# Patient Record
Sex: Male | Born: 1960 | Race: White | Hispanic: No | Marital: Married | Smoking: Never smoker
Health system: Southern US, Community
[De-identification: ages and names within clinical notes are randomized; demographics above are authoritative.]

## PROBLEM LIST (undated history)

## (undated) DIAGNOSIS — J189 Pneumonia, unspecified organism: Secondary | ICD-10-CM

## (undated) DIAGNOSIS — G43909 Migraine, unspecified, not intractable, without status migrainosus: Secondary | ICD-10-CM

## (undated) DIAGNOSIS — F32A Depression, unspecified: Secondary | ICD-10-CM

## (undated) DIAGNOSIS — K5732 Diverticulitis of large intestine without perforation or abscess without bleeding: Secondary | ICD-10-CM

## (undated) DIAGNOSIS — K640 First degree hemorrhoids: Secondary | ICD-10-CM

## (undated) DIAGNOSIS — T7840XA Allergy, unspecified, initial encounter: Secondary | ICD-10-CM

## (undated) DIAGNOSIS — K635 Polyp of colon: Secondary | ICD-10-CM

## (undated) DIAGNOSIS — G4700 Insomnia, unspecified: Secondary | ICD-10-CM

## (undated) DIAGNOSIS — F329 Major depressive disorder, single episode, unspecified: Secondary | ICD-10-CM

## (undated) DIAGNOSIS — F419 Anxiety disorder, unspecified: Secondary | ICD-10-CM

## (undated) DIAGNOSIS — M810 Age-related osteoporosis without current pathological fracture: Secondary | ICD-10-CM

## (undated) DIAGNOSIS — R37 Sexual dysfunction, unspecified: Secondary | ICD-10-CM

## (undated) DIAGNOSIS — K219 Gastro-esophageal reflux disease without esophagitis: Secondary | ICD-10-CM

## (undated) DIAGNOSIS — F319 Bipolar disorder, unspecified: Secondary | ICD-10-CM

## (undated) DIAGNOSIS — E785 Hyperlipidemia, unspecified: Secondary | ICD-10-CM

## (undated) HISTORY — DX: Bipolar disorder, unspecified: F31.9

## (undated) HISTORY — PX: SHOULDER ARTHROSCOPY: SHX128

## (undated) HISTORY — DX: Hyperlipidemia, unspecified: E78.5

## (undated) HISTORY — PX: TONSILLECTOMY: SUR1361

## (undated) HISTORY — PX: ROTATOR CUFF REPAIR: SHX139

## (undated) HISTORY — DX: First degree hemorrhoids: K64.0

## (undated) HISTORY — PX: UPPER GASTROINTESTINAL ENDOSCOPY: SHX188

## (undated) HISTORY — DX: Depression, unspecified: F32.A

## (undated) HISTORY — DX: Anxiety disorder, unspecified: F41.9

## (undated) HISTORY — DX: Gastro-esophageal reflux disease without esophagitis: K21.9

## (undated) HISTORY — DX: Major depressive disorder, single episode, unspecified: F32.9

## (undated) HISTORY — DX: Polyp of colon: K63.5

## (undated) HISTORY — DX: Allergy, unspecified, initial encounter: T78.40XA

## (undated) HISTORY — DX: Diverticulitis of large intestine without perforation or abscess without bleeding: K57.32

## (undated) HISTORY — DX: Sexual dysfunction, unspecified: R37

## (undated) HISTORY — DX: Migraine, unspecified, not intractable, without status migrainosus: G43.909

## (undated) HISTORY — PX: CARPAL TUNNEL RELEASE: SHX101

## (undated) HISTORY — DX: Pneumonia, unspecified organism: J18.9

## (undated) HISTORY — PX: COLONOSCOPY: SHX174

## (undated) HISTORY — DX: Insomnia, unspecified: G47.00

## (undated) HISTORY — PX: APPENDECTOMY: SHX54

## (undated) HISTORY — PX: SHOULDER SURGERY: SHX246

## (undated) HISTORY — DX: Age-related osteoporosis without current pathological fracture: M81.0

---

## 2009-04-20 LAB — HM DEXA SCAN

## 2011-06-26 DIAGNOSIS — K635 Polyp of colon: Secondary | ICD-10-CM

## 2011-06-26 DIAGNOSIS — K5732 Diverticulitis of large intestine without perforation or abscess without bleeding: Secondary | ICD-10-CM

## 2011-06-26 DIAGNOSIS — K640 First degree hemorrhoids: Secondary | ICD-10-CM

## 2011-06-26 HISTORY — DX: Polyp of colon: K63.5

## 2011-06-26 HISTORY — DX: Diverticulitis of large intestine without perforation or abscess without bleeding: K57.32

## 2011-06-26 HISTORY — DX: First degree hemorrhoids: K64.0

## 2011-06-26 LAB — HM COLONOSCOPY

## 2015-08-11 ENCOUNTER — Ambulatory Visit (INDEPENDENT_AMBULATORY_CARE_PROVIDER_SITE_OTHER): Payer: BLUE CROSS/BLUE SHIELD | Admitting: Medical

## 2015-08-11 ENCOUNTER — Encounter: Payer: Self-pay | Admitting: Medical

## 2015-08-11 VITALS — BP 102/70 | HR 68 | Ht 67.25 in | Wt 185.0 lb

## 2015-08-11 DIAGNOSIS — M25511 Pain in right shoulder: Secondary | ICD-10-CM | POA: Diagnosis not present
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
## 2015-08-16 ENCOUNTER — Telehealth: Payer: Self-pay | Admitting: Internal Medicine

## 2015-08-16 NOTE — Telephone Encounter (Signed)
Rcvd Medical Records from [facility] on pt.

## 2015-08-17 ENCOUNTER — Encounter: Payer: Self-pay | Admitting: Physical Therapy

## 2015-08-17 ENCOUNTER — Ambulatory Visit: Payer: BLUE CROSS/BLUE SHIELD | Attending: Medical | Admitting: Physical Therapy

## 2015-08-17 DIAGNOSIS — M6281 Muscle weakness (generalized): Secondary | ICD-10-CM | POA: Diagnosis not present

## 2015-08-17 DIAGNOSIS — M25511 Pain in right shoulder: Secondary | ICD-10-CM | POA: Diagnosis not present
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
## 2015-08-17 NOTE — Patient Instructions (Signed)
External Rotator Cuff Stretch, Supine    Lie supine, fingers clasped behind head, elbows close together. Pull elbows backward while pinching shoulder blades. Hold _10__ seconds. Repeat _5__ times per session. Do _2__ sessions per day.  Copyright  VHI. All rights reserved.  Cane Exercise: Flexion    Lie on back, holding cane above chest. Keeping arms as straight as possible, lower cane toward floor beyond head. Hold _5__ seconds. Repeat ___10_ times. Do ___2_ sessions per day.  http://gt2.exer.us/91   Copyright  VHI. All rights reserved.  ROM: Pendulum (Circular)    Let right arm move in circle clockwise, then counterclockwise, by rocking body weight in circular pattern. Circle __10__ times each direction per set. Do __2__ sets per session. Do _2___ sessions per day.  http://orth.exer.us/794   Copyright  VHI. All rights reserved.  [facility] [address] [place], [place] [address] Phone # [phone] Fax [phone]

## 2015-08-19 ENCOUNTER — Ambulatory Visit: Payer: BLUE CROSS/BLUE SHIELD | Admitting: Physical Therapy

## 2015-08-19 ENCOUNTER — Encounter: Payer: Self-pay | Admitting: Physical Therapy

## 2015-08-19 DIAGNOSIS — M25511 Pain in right shoulder: Secondary | ICD-10-CM | POA: Diagnosis not present

## 2015-08-19 DIAGNOSIS — M6281 Muscle weakness (generalized): Secondary | ICD-10-CM | POA: Diagnosis not present

## 2015-08-19 NOTE — Patient Instructions (Signed)
Over Head Pull: Wide Grip    On back, knees bent, feet flat, band across thighs, elbows straight but relaxed. Pull hands apart (start). Keeping elbows straight, bring arms up and over head, hands toward floor. Keep steady pull on band. Hold momentarily. Return slowly, keeping pull steady, back to start. Repeat __10_ times. Band color _yellow_____    Copyright  VHI. All rights reserved.  Side Pull: Double Arm    On back, knees bent, feet flat. Arms perpendicular to body, shoulder level, elbows straight but relaxed. Pull arms out to sides, elbows straight. Resistance band comes across collarbones, hands toward floor. Hold momentarily. Slowly return to starting position. Repeat _10__ times. Band color _yellow____    Copyright  VHI. All rights reserved.  Strengthening: Isometric External Rotation    Using wall to provide resistance, and keeping right arm at side, press back of hand into ball using light pressure where there is no pain. Hold __5__ seconds. Repeat __5__ times per set. Do ___1_ sets per session. Do __1__ sessions per day.  http://orth.exer.us/814   Copyright  VHI. All rights reserved.   IONTOPHORESIS PATIENT PRECAUTIONS & CONTRAINDICATIONS:  . Redness under one or both electrodes can occur.  This characterized by a uniform redness that usually disappears within 12 hours of treatment. . Small pinhead size blisters may result in response to the drug.  Contact your physician if the problem persists more than 24 hours. . On rare occasions, iontophoresis therapy can result in temporary skin reactions such as rash, inflammation, irritation or burns.  The skin reactions may be the result of individual sensitivity to the ionic solution used, the condition of the skin at the start of treatment, reaction to the materials in the electrodes, allergies or sensitivity to dexamethasone, or a poor connection between the patch and your skin.  Discontinue using iontophoresis if you have any  of these reactions and report to your therapist. . Remove the Patch or electrodes if you have any undue sensation of pain or burning during the treatment and report discomfort to your therapist. . Tell your Therapist if you have had known adverse reactions to the application of electrical current. . If using the Patch, the LED light will turn off when treatment is complete and the patch can be removed.  Approximate treatment time is 1-3 hours.  Remove the patch when light goes off or after 6 hours. . The Patch can be worn during normal activity, however excessive motion where the electrodes have been placed can cause poor contact between the skin and the electrode or uneven electrical current resulting in greater risk of skin irritation. Marland Kitchen Keep out of the reach of children.  DO NOT use if you have a cardiac pacemaker or any other electrically sensitive implanted device. . DO NOT use if you have a known sensitivity to dexamethasone. . DO NOT use during Magnetic Resonance Imaging (MRI). . DO NOT use over broken or compromised skin (e.g. sunburn, cuts, or acne) due to the increased risk of skin reaction. . DO NOT SHAVE over the area to be treated:  To establish good contact between the Patch and the skin, excessive hair may be clipped. . DO NOT place the Patch or electrodes on or over your eyes, directly over your heart, or brain. . DO NOT reuse the Patch or electrodes as this may cause burns to occur. [place] [address] [place], [place] [address] Phone # [phone] Fax [phone]
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
## 2015-08-22 ENCOUNTER — Encounter (HOSPITAL_COMMUNITY): Payer: Self-pay

## 2015-08-22 ENCOUNTER — Emergency Department (HOSPITAL_COMMUNITY)
Admission: EM | Admit: 2015-08-22 | Discharge: 2015-08-22 | Disposition: A | Discharge status: Home / Self Care | Payer: BLUE CROSS/BLUE SHIELD | Attending: Emergency Medicine | Admitting: Emergency Medicine

## 2015-08-22 DIAGNOSIS — K0889 Other specified disorders of teeth and supporting structures: Secondary | ICD-10-CM | POA: Insufficient documentation

## 2015-08-22 DIAGNOSIS — F329 Major depressive disorder, single episode, unspecified: Secondary | ICD-10-CM | POA: Diagnosis not present

## 2015-08-22 DIAGNOSIS — Z791 Long term (current) use of non-steroidal anti-inflammatories (NSAID): Secondary | ICD-10-CM | POA: Diagnosis not present

## 2015-08-22 DIAGNOSIS — Z79899 Other long term (current) drug therapy: Secondary | ICD-10-CM | POA: Diagnosis not present

## 2015-08-22 MED ORDER — AMOXICILLIN-POT CLAVULANATE 875-125 MG PO TABS: 1.0000 | ORAL_TABLET | Freq: Two times a day (BID) | ORAL | Status: DC

## 2015-08-22 MED ORDER — HYDROCODONE-ACETAMINOPHEN 5-325 MG PO TABS
1.0000 | ORAL_TABLET | Freq: Once | ORAL | Status: AC
  Administered 2015-08-22: 1 via ORAL
  Filled 2015-08-22: qty 1

## 2015-08-22 MED ORDER — BUPIVACAINE-EPINEPHRINE (PF) 0.5% -1:200000 IJ SOLN
1.8000 mL | Freq: Once | INTRAMUSCULAR | Status: AC
  Administered 2015-08-22: 1.8 mL
  Filled 2015-08-22: qty 1.8

## 2015-08-22 MED ORDER — NAPROXEN 500 MG PO TABS: 500.0000 mg | ORAL_TABLET | Freq: Two times a day (BID) | ORAL | Status: DC

## 2015-08-22 MED ORDER — AMOXICILLIN-POT CLAVULANATE 875-125 MG PO TABS
1.0000 | ORAL_TABLET | Freq: Once | ORAL | Status: AC
  Administered 2015-08-22: 1 via ORAL
  Filled 2015-08-22: qty 1

## 2015-08-22 NOTE — ED Notes (Signed)
Dental pain since Friday and getting worse.  Lump under jaw.

## 2015-08-22 NOTE — Discharge Instructions (Signed)
[facility] [employer] is a great source of information about community services available.  Access by dialing 2-1-1 from anywhere in [place], or by website -  CustodianSupply.fi.   Other Local Resources (Updated 04/2015)  Dental  Care   Services    Phone Number and Address  Cost  [facility] For children 62 - 54 years of age:   Cleaning  Tooth brushing/flossing instruction  Sealants, fillings, crowns  Extractions  Emergency treatment  [phone] [address], [place] [address] Charges based on family income.  Medicaid and some insurance plans accepted.     [facility] - [facility], fillings, crowns  Extractions  Emergency treatment [phone] [address] [place], [place]  Pregnant women 39 years of age or older with a Medicaid card  [facility] - [place]  Cleaning  Sealants, fillings, crowns  Extractions  Emergency treatment [phone] [address] [place], [place] Pregnant women 73 years of age or older with a Medicaid card  [facility] For children 43 - 33 years of age:   Cleaning  Tooth brushing/flossing instruction  Sealants, fillings, crowns  Extractions  Emergency treatment Limited orthodontic services for patients with Medicaid [phone], [place] [address] Medicaid and [facility] Health Choice cover for children up to age 71 and pregnant women.  Parents of children up to age 47 without Medicaid pay a reduced fee at time of service.  [place] [facility] For children 88 - 33 years of age:   Cleaning  Tooth brushing/flossing instruction  Sealants, fillings, crowns  Extractions  Emergency treatment Limited orthodontic services for patients with Medicaid  [phone] [place], [place].  Medicaid and [facility] [place] Health Choice cover for children up to age 26 and pregnant women.  Parents of children up to age 42 without Medicaid pay a reduced fee.  [facility]  Sealants, fillings, crowns  Extractions  Hours: Tuesdays and Thursdays, 4:15 - 8 pm [phone] [address] [place], [place] [address] Services free of charge to [facility] residents ages 18-64 who do not have health insurance, Medicare, [place], or [place] benefits and fall within federal poverty guidelines  [place] care in addition to primary medical care, nutritional counseling, and pharmacy:  Engineer, drilling, fillings, crowns  Extractions                  [phone] [facility] [place], [place], [place], [place], [place], [place] [facility], [place], [place] [facility] [place], [place], [place], most insurance.  Also provides services available to all with fees adjusted based on ability to pay.    [facility]  Cleaning  Tooth brushing/flossing instruction  Sealants, fillings, crowns  Extractions  Emergency treatment Hours: Tuesdays, Thursdays, and Fridays from 8 am to 5 pm by appointment only. [phone] [place], [place] [address] [facility] residents with Medicaid (depending on eligibility) and children with [facility] Health Choice - call for more information.  [facility] • Extractions only ° °Hours: 2nd and 4th Thursday of each month from 6:30 am - 9 am.   [phone] ext. [phone] °[place], [place] [address] Ages 18 and older only.  Patients are seen on a first come, first served basis.  °[facility] • Cleanings °• Fillings °• Extractions °• Orthodontics °• Endodontics °• Implants/Crowns/Bridges °• Complete and partial dentures [phone] °[place], [place] Patients must complete an application for services.  There is often a waiting list.   ° °
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
## 2015-08-24 ENCOUNTER — Encounter: Payer: BLUE CROSS/BLUE SHIELD | Admitting: Physical Therapy

## 2015-08-26 ENCOUNTER — Ambulatory Visit: Payer: BLUE CROSS/BLUE SHIELD | Admitting: Physical Therapy

## 2015-08-26 ENCOUNTER — Encounter: Payer: Self-pay | Admitting: Physical Therapy

## 2015-08-26 DIAGNOSIS — M25511 Pain in right shoulder: Secondary | ICD-10-CM

## 2015-08-26 DIAGNOSIS — M6281 Muscle weakness (generalized): Secondary | ICD-10-CM | POA: Diagnosis not present
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
## 2015-08-31 ENCOUNTER — Ambulatory Visit: Payer: BLUE CROSS/BLUE SHIELD | Admitting: Physical Therapy

## 2015-08-31 ENCOUNTER — Telehealth: Payer: Self-pay | Admitting: Medical

## 2015-08-31 ENCOUNTER — Encounter: Payer: Self-pay | Admitting: Physical Therapy

## 2015-08-31 DIAGNOSIS — M6281 Muscle weakness (generalized): Secondary | ICD-10-CM | POA: Diagnosis not present

## 2015-08-31 DIAGNOSIS — M25511 Pain in right shoulder: Secondary | ICD-10-CM | POA: Diagnosis not present

## 2015-08-31 NOTE — Patient Instructions (Signed)
ROM: Abduction (Standing)    Bring arms straight out from sides and raise as high as possible without pain. Face wall. Relax upper trap. No shoulders in ears.  Repeat __10__ times per set. Do _2___ sets per session. Do _1___ sessions per day.  http://orth.exer.us/910     Copyright  VHI. All rights reserved.  ROM: Flexion (Standing)    Bring arms straight out in front and raise as high as possible without pain. Keep palms facing up. Back against the wall. Tighten abdominals.  Repeat __10__ times per set. Do _2___ sets per session. Do __1__ sessions per day.  http://orth.exer.us/908   Copyright  VHI. All rights reserved.     Secure a band or belt high around a stable object. Place one hand into the loop and turn the hand so the palm is facing toward the ceiling. Create tension in the band. Fold forward by pushing the hips back until a stretch is felt in the shoulder and down through the side of the rib cage & back. Keep the abdominals activated and contract the glutes to ensure a neutral spine during the stretch.  Do on counter with both arms.  Hold for 30 seconds 2 times 1 time per day.    Scapular Retraction: Abduction / Extension (Prone)    Lie with arms out from sides 90. Pinch shoulder blades together and raise arms a few inches from floor. Repeat _10___ times per set. Do _2___ sets per session. Do __1__ sessions per day.  http://orth.exer.us/958   Copyright  VHI. All rights reserved.   Scapular Retraction (Prone)    Lie with arms at sides. Pinch shoulder blades together and raise arms a few inches from floor. Repeat _10___ times per set. Do __2__ sets per session. Do _1___ sessions per day.  http://orth.exer.us/954   Copyright  VHI. All rights reserved.   [place] [address] [place], [place] [address] Phone # [phone] Fax [phone]

## 2015-08-31 NOTE — Telephone Encounter (Signed)
error 
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
## 2015-09-02 ENCOUNTER — Ambulatory Visit: Payer: BLUE CROSS/BLUE SHIELD | Admitting: Physical Therapy

## 2015-09-07 ENCOUNTER — Encounter: Payer: BLUE CROSS/BLUE SHIELD | Admitting: Physical Therapy

## 2015-09-09 ENCOUNTER — Encounter: Payer: BLUE CROSS/BLUE SHIELD | Admitting: Physical Therapy

## 2015-11-01 ENCOUNTER — Encounter: Payer: Self-pay | Admitting: Medical

## 2015-11-01 ENCOUNTER — Ambulatory Visit (INDEPENDENT_AMBULATORY_CARE_PROVIDER_SITE_OTHER): Payer: BLUE CROSS/BLUE SHIELD | Admitting: Medical

## 2015-11-01 VITALS — BP 138/84 | HR 64 | Temp 97.8°F | Wt 193.6 lb

## 2015-11-01 DIAGNOSIS — M859 Disorder of bone density and structure, unspecified: Secondary | ICD-10-CM

## 2015-11-01 DIAGNOSIS — M8580 Other specified disorders of bone density and structure, unspecified site: Secondary | ICD-10-CM

## 2015-11-01 DIAGNOSIS — F319 Bipolar disorder, unspecified: Secondary | ICD-10-CM

## 2015-11-01 DIAGNOSIS — M25511 Pain in right shoulder: Secondary | ICD-10-CM | POA: Insufficient documentation

## 2015-11-01 DIAGNOSIS — F3130 Bipolar disorder, current episode depressed, mild or moderate severity, unspecified: Secondary | ICD-10-CM | POA: Diagnosis not present

## 2015-11-01 DIAGNOSIS — R7989 Other specified abnormal findings of blood chemistry: Secondary | ICD-10-CM

## 2015-11-01 DIAGNOSIS — E291 Testicular hypofunction: Secondary | ICD-10-CM | POA: Insufficient documentation

## 2015-11-01 DIAGNOSIS — Z79899 Other long term (current) drug therapy: Secondary | ICD-10-CM

## 2015-11-01 DIAGNOSIS — G4700 Insomnia, unspecified: Secondary | ICD-10-CM | POA: Diagnosis not present

## 2015-11-01 LAB — CBC
HEMATOCRIT: 43.5 % (ref 38.5–50.0)
Hemoglobin: 14.6 g/dL (ref 13.2–17.1)
MCH: 29.6 pg (ref 27.0–33.0)
MCHC: 33.6 g/dL (ref 32.0–36.0)
MCV: 88.2 fL (ref 80.0–100.0)
MPV: 8.8 fL (ref 7.5–12.5)
Platelets: 281 10*3/uL (ref 140–400)
RBC: 4.93 MIL/uL (ref 4.20–5.80)
RDW: 14.5 % (ref 11.0–15.0)
WBC: 6.7 10*3/uL (ref 4.0–10.5)

## 2015-11-01 LAB — TSH: TSH: 1.41 mIU/L (ref 0.40–4.50)

## 2015-11-01 MED ORDER — ESCITALOPRAM OXALATE 10 MG PO TABS: 10.0000 mg | ORAL_TABLET | Freq: Every day | ORAL | 2 refills | Status: DC

## 2015-11-01 MED ORDER — TRAZODONE HCL 50 MG PO TABS: 50.0000 mg | ORAL_TABLET | Freq: Every day | ORAL | 2 refills | Status: DC

## 2015-11-01 MED ORDER — RISPERIDONE 1 MG PO TABS
ORAL_TABLET | ORAL | 1 refills | Status: DC
Start: 2015-11-01 — End: 2015-12-26
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
## 2015-11-02 ENCOUNTER — Other Ambulatory Visit: Payer: Self-pay | Admitting: Medical

## 2015-11-02 DIAGNOSIS — M25511 Pain in right shoulder: Secondary | ICD-10-CM | POA: Diagnosis not present

## 2015-11-02 LAB — COMPREHENSIVE METABOLIC PANEL
ALT: 36 U/L (ref 9–46)
AST: 26 U/L (ref 10–35)
Albumin: 4.2 g/dL (ref 3.6–5.1)
Alkaline Phosphatase: 64 U/L (ref 40–115)
BUN: 16 mg/dL (ref 7–25)
CHLORIDE: 105 mmol/L (ref 98–110)
CO2: 26 mmol/L (ref 20–31)
CREATININE: 1.09 mg/dL (ref 0.70–1.33)
Calcium: 9.8 mg/dL (ref 8.6–10.3)
GLUCOSE: 114 mg/dL — AB (ref 65–99)
Potassium: 4.9 mmol/L (ref 3.5–5.3)
SODIUM: 142 mmol/L (ref 135–146)
TOTAL PROTEIN: 6.6 g/dL (ref 6.1–8.1)
Total Bilirubin: 0.5 mg/dL (ref 0.2–1.2)

## 2015-11-02 LAB — VITAMIN D 25 HYDROXY (VIT D DEFICIENCY, FRACTURES): Vit D, 25-Hydroxy: 28 ng/mL — ABNORMAL LOW (ref 30–100)

## 2015-11-02 LAB — TESTOSTERONE: TESTOSTERONE: 163 ng/dL — AB (ref 250–827)

## 2015-11-02 LAB — PSA: PSA: 0.53 ng/mL (ref <=4.00)

## 2015-11-02 MED ORDER — VITAMIN D (ERGOCALCIFEROL) 1.25 MG (50000 UNIT) PO CAPS: 50000.0000 [IU] | ORAL_CAPSULE | ORAL | 1 refills | Status: DC

## 2015-11-16 ENCOUNTER — Ambulatory Visit (INDEPENDENT_AMBULATORY_CARE_PROVIDER_SITE_OTHER): Payer: BLUE CROSS/BLUE SHIELD | Admitting: Medical

## 2015-11-16 ENCOUNTER — Encounter: Payer: Self-pay | Admitting: Medical

## 2015-11-16 VITALS — BP 114/78 | HR 73 | Wt 193.0 lb

## 2015-11-16 DIAGNOSIS — R7989 Other specified abnormal findings of blood chemistry: Secondary | ICD-10-CM

## 2015-11-16 DIAGNOSIS — E559 Vitamin D deficiency, unspecified: Secondary | ICD-10-CM | POA: Diagnosis not present

## 2015-11-16 DIAGNOSIS — G4700 Insomnia, unspecified: Secondary | ICD-10-CM

## 2015-11-16 DIAGNOSIS — E291 Testicular hypofunction: Secondary | ICD-10-CM

## 2015-11-16 DIAGNOSIS — F3130 Bipolar disorder, current episode depressed, mild or moderate severity, unspecified: Secondary | ICD-10-CM | POA: Diagnosis not present

## 2015-11-16 DIAGNOSIS — R7301 Impaired fasting glucose: Secondary | ICD-10-CM

## 2015-11-16 DIAGNOSIS — F319 Bipolar disorder, unspecified: Secondary | ICD-10-CM

## 2015-11-16 DIAGNOSIS — M8580 Other specified disorders of bone density and structure, unspecified site: Secondary | ICD-10-CM

## 2015-11-16 DIAGNOSIS — M859 Disorder of bone density and structure, unspecified: Secondary | ICD-10-CM

## 2015-11-16 MED ORDER — TESTOSTERONE 10 MG/ACT (2%) TD GEL: 2.0000 | Freq: Every day | TRANSDERMAL | 2 refills | Status: DC
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
## 2015-11-17 ENCOUNTER — Other Ambulatory Visit: Payer: Self-pay | Admitting: Medical

## 2015-11-17 DIAGNOSIS — R7989 Other specified abnormal findings of blood chemistry: Secondary | ICD-10-CM

## 2015-11-17 DIAGNOSIS — E229 Hyperfunction of pituitary gland, unspecified: Principal | ICD-10-CM

## 2015-11-17 DIAGNOSIS — E291 Testicular hypofunction: Secondary | ICD-10-CM

## 2015-11-17 LAB — FSH/LH
FSH: 1.9 m[IU]/mL (ref 1.6–8.0)
LH: 1.5 m[IU]/mL (ref 1.5–9.3)

## 2015-11-17 LAB — TESTOSTERONE: Testosterone: 167 ng/dL — ABNORMAL LOW (ref 250–827)

## 2015-11-17 LAB — PROLACTIN: PROLACTIN: 23.1 ng/mL — AB (ref 2.0–18.0)

## 2015-11-18 ENCOUNTER — Other Ambulatory Visit: Payer: Self-pay | Admitting: Medical

## 2015-11-18 DIAGNOSIS — R7989 Other specified abnormal findings of blood chemistry: Secondary | ICD-10-CM

## 2015-11-18 DIAGNOSIS — E291 Testicular hypofunction: Secondary | ICD-10-CM

## 2015-11-18 DIAGNOSIS — E229 Hyperfunction of pituitary gland, unspecified: Principal | ICD-10-CM

## 2015-11-26 ENCOUNTER — Ambulatory Visit
Admission: RE | Admit: 2015-11-26 | Discharge: 2015-11-26 | Disposition: A | Discharge status: Home / Self Care | Payer: BLUE CROSS/BLUE SHIELD | Source: Ambulatory Visit | Attending: Medical | Admitting: Medical

## 2015-11-26 DIAGNOSIS — E291 Testicular hypofunction: Secondary | ICD-10-CM

## 2015-11-26 DIAGNOSIS — R7989 Other specified abnormal findings of blood chemistry: Secondary | ICD-10-CM

## 2015-11-26 DIAGNOSIS — E229 Hyperfunction of pituitary gland, unspecified: Principal | ICD-10-CM

## 2015-11-26 DIAGNOSIS — R930 Abnormal findings on diagnostic imaging of skull and head, not elsewhere classified: Secondary | ICD-10-CM | POA: Diagnosis not present

## 2015-11-26 MED ORDER — GADOBENATE DIMEGLUMINE 529 MG/ML IV SOLN
10.0000 mL | Freq: Once | INTRAVENOUS | Status: AC | PRN
  Administered 2015-11-26: 10 mL via INTRAVENOUS

## 2015-12-26 ENCOUNTER — Other Ambulatory Visit: Payer: Self-pay | Admitting: Medical

## 2015-12-26 DIAGNOSIS — F319 Bipolar disorder, unspecified: Secondary | ICD-10-CM

## 2015-12-27 NOTE — Telephone Encounter (Signed)
Is this okay to refill? 

## 2016-01-03 ENCOUNTER — Ambulatory Visit (INDEPENDENT_AMBULATORY_CARE_PROVIDER_SITE_OTHER): Payer: BLUE CROSS/BLUE SHIELD | Admitting: Medical

## 2016-01-03 ENCOUNTER — Ambulatory Visit: Payer: BLUE CROSS/BLUE SHIELD | Admitting: Family Medicine

## 2016-01-03 ENCOUNTER — Encounter: Payer: Self-pay | Admitting: Medical

## 2016-01-03 VITALS — BP 106/84 | HR 88 | Ht 67.25 in | Wt 192.0 lb

## 2016-01-03 DIAGNOSIS — F3130 Bipolar disorder, current episode depressed, mild or moderate severity, unspecified: Secondary | ICD-10-CM

## 2016-01-03 DIAGNOSIS — F411 Generalized anxiety disorder: Secondary | ICD-10-CM | POA: Diagnosis not present

## 2016-01-03 DIAGNOSIS — F319 Bipolar disorder, unspecified: Secondary | ICD-10-CM

## 2016-01-03 DIAGNOSIS — E291 Testicular hypofunction: Secondary | ICD-10-CM | POA: Diagnosis not present

## 2016-01-03 DIAGNOSIS — F410 Panic disorder [episodic paroxysmal anxiety] without agoraphobia: Secondary | ICD-10-CM | POA: Insufficient documentation

## 2016-01-03 DIAGNOSIS — E349 Endocrine disorder, unspecified: Secondary | ICD-10-CM | POA: Diagnosis not present

## 2016-01-03 DIAGNOSIS — R7989 Other specified abnormal findings of blood chemistry: Secondary | ICD-10-CM

## 2016-01-03 MED ORDER — CLONAZEPAM 0.5 MG PO TABS: 0.5000 mg | ORAL_TABLET | Freq: Two times a day (BID) | ORAL | 0 refills | Status: DC | PRN
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
## 2016-01-13 ENCOUNTER — Encounter: Payer: Self-pay | Admitting: Medical

## 2016-01-17 ENCOUNTER — Ambulatory Visit (HOSPITAL_COMMUNITY)
Admission: RE | Admit: 2016-01-17 | Discharge: 2016-01-17 | Disposition: A | Discharge status: Home / Self Care | Payer: Self-pay | Attending: Psychiatry | Admitting: Psychiatry

## 2016-01-17 DIAGNOSIS — F329 Major depressive disorder, single episode, unspecified: Secondary | ICD-10-CM | POA: Insufficient documentation

## 2016-01-17 DIAGNOSIS — F411 Generalized anxiety disorder: Secondary | ICD-10-CM | POA: Insufficient documentation
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
## 2016-01-17 NOTE — H&P (Signed)
Behavioral Health Medical Screening Exam  Jesse Hardin is an 55 y.o. male who presented as a walk in with his mother in law for reports of worsening depression. He reports a similar episode "eleven years ago", which responded to an out of state IOP program. At this time the patient does not meet criteria for inpatient admission but is appropriate for referral to IOP program. Denies current suicidal ideation. The patient denies any significant medical history during assessment.   Total Time spent with patient: 20 minutes  Psychiatric Specialty Exam: Physical Exam  Constitutional: He is oriented to person, place, and time. He appears well-developed and well-nourished.  HENT:  Head: Normocephalic and atraumatic.  Right Ear: External ear normal.  Left Ear: External ear normal.  Mouth/Throat: Oropharynx is clear and moist.  Eyes: Conjunctivae are normal. Pupils are equal, round, and reactive to light.  Neck: Normal range of motion. Neck supple.  Cardiovascular: Normal rate, regular rhythm, normal heart sounds and intact distal pulses.   Respiratory: Effort normal and breath sounds normal.  GI: Soft. Bowel sounds are normal.  Musculoskeletal: Normal range of motion.  Neurological: He is alert and oriented to person, place, and time.  Skin: Skin is warm and dry.    Review of Systems  Constitutional: Negative for fever and weight loss.  HENT: Negative for ear pain, hearing loss and tinnitus.   Eyes: Negative for blurred vision, double vision and photophobia.  Respiratory: Negative for cough, hemoptysis and sputum production.   Cardiovascular: Negative for chest pain, palpitations and orthopnea.  Gastrointestinal: Negative for heartburn, nausea and vomiting.  Genitourinary: Negative for dysuria, frequency and urgency.  Musculoskeletal: Negative for myalgias and neck pain.  Skin: Negative for itching and rash.  Neurological: Negative for dizziness, tingling, tremors, sensory change and  headaches.  Psychiatric/Behavioral: Positive for depression. Negative for hallucinations, memory loss, substance abuse and suicidal ideas. The patient is nervous/anxious and has insomnia.     Blood pressure 124/77, pulse 72, temperature 98.6 F (37 C), resp. rate 18.There is no height or weight on file to calculate BMI.  General Appearance: Casual  Eye Contact:  Good  Speech:  Clear and Coherent  Volume:  Normal  Mood:  Depressed  Affect:  Congruent  Thought Process:  Coherent  Orientation:  Full (Time, Place, and Person)  Thought Content:  Symptoms of depression  Suicidal Thoughts:  No  Homicidal Thoughts:  No  Memory:  Immediate;   Good Recent;   Good Remote;   Good  Judgement:  Intact  Insight:  Present  Psychomotor Activity:  Normal  Concentration: Concentration: Fair and Attention Span: Good  Recall:  Good  Fund of Knowledge:Good  Language: Good  Akathisia:  No  Handed:  Right  AIMS (if indicated):     Assets:  Communication Skills Desire for Improvement Financial Resources/Insurance Housing Intimacy Leisure Time Physical Health Resilience  Sleep:       Musculoskeletal: Strength & Muscle Tone: within normal limits Gait & Station: normal Patient leans: N/A  Blood pressure 124/77, pulse 72, temperature 98.6 F (37 C), resp. rate 18.  Recommendations:  Based on my evaluation the patient does not appear to have an emergency medical condition.  Elmarie Shiley, NP 01/17/2016, 7:26 PM

## 2016-01-20 ENCOUNTER — Other Ambulatory Visit (HOSPITAL_COMMUNITY): Payer: BLUE CROSS/BLUE SHIELD | Attending: Psychiatry | Admitting: Psychiatry

## 2016-01-20 ENCOUNTER — Encounter (HOSPITAL_COMMUNITY): Payer: Self-pay | Admitting: Psychiatry

## 2016-01-20 DIAGNOSIS — F332 Major depressive disorder, recurrent severe without psychotic features: Secondary | ICD-10-CM | POA: Insufficient documentation

## 2016-01-20 DIAGNOSIS — F419 Anxiety disorder, unspecified: Secondary | ICD-10-CM | POA: Insufficient documentation

## 2016-01-20 DIAGNOSIS — G4700 Insomnia, unspecified: Secondary | ICD-10-CM | POA: Diagnosis not present

## 2016-01-20 DIAGNOSIS — G43909 Migraine, unspecified, not intractable, without status migrainosus: Secondary | ICD-10-CM | POA: Insufficient documentation

## 2016-01-20 DIAGNOSIS — F319 Bipolar disorder, unspecified: Secondary | ICD-10-CM | POA: Insufficient documentation

## 2016-01-20 DIAGNOSIS — F900 Attention-deficit hyperactivity disorder, predominantly inattentive type: Secondary | ICD-10-CM | POA: Diagnosis not present

## 2016-01-20 DIAGNOSIS — F3130 Bipolar disorder, current episode depressed, mild or moderate severity, unspecified: Secondary | ICD-10-CM | POA: Diagnosis not present

## 2016-01-20 MED ORDER — BUPROPION HCL ER (SR) 200 MG PO TB12: 200.0000 mg | ORAL_TABLET | Freq: Two times a day (BID) | ORAL | 0 refills | Status: DC

## 2016-01-20 NOTE — Progress Notes (Signed)
Comprehensive Clinical Assessment (CCA) Note  01/20/2016 Jesse Hardin [id number]  Visit Diagnosis:      ICD-9-CM ICD-10-CM   1. Severe recurrent major depression without psychotic features ([place]) 296.33 F33.2       CCA Part One  Part One has been completed on paper by the patient.  (See scanned document in Chart Review)  CCA Part Two A  Intake/Chief Complaint:  CCA Intake With Chief Complaint CCA Part Two Date: 01/20/16 CCA Part Two Time: [id number] Chief Complaint/Presenting Problem: This is a 55 yr old, married, unemployed male; who was referred per TTS; treatment for worsening depressive and anxiety symptoms.  Admits to passive SI; denies a plan or intent.  Discussed safety options at length with pt.  Pt is able to contract for safety.  Denies any prior suicide attempts or gestures.  Reports prior MH-IOP treatment in [place] after the divorce from wife in 1996.  Pt has been seeing therapist Deneise Lever Hadgkiss, LCSW) for ~ three visits.  Pt reports he has been feeling increasingly depressed over the last several weeks due to losing a job opportunity. Pt reports he was "really excited and happy" about the job and when he learned he did not get it, he became depressed. Pt reports he has been waking up feeling "cloudy" and does not desire to get out of bed. Pt reports his husband was out of town which was another stressor for him; but husband cut business trip short to come be home with pt.   Pt reports he has been feeling sad for no reason and he cannot explain or understand why.   Pt did voice having anxiety about his finances.  Before moving to [place] from [place], he cashed out his retirement fund in Jan. 2017.  The couple moved to [place] due to husband's job.  Pt reports he had a recent change in his medications (PCP increased the Lexapro two weeks ago to 20 mg; and started him on Risperdal )and he feels this may be a contributing factor to his increasing depression. Pt is requesting to start back on  Wellbutrin; which helped him in the past.  Family HX:  Mother and Sister (Depression). Patients Currently Reported Symptoms/Problems: Sadness, tearfulness, anhedonia, lack of motivation, no energy, poor sleep (difficulty getting sleep), poor appetite (lost 10 lbs within two weeks), isolative, poor concentration, ruminating thoughts, feelings of hopelessness, helplessness, poor self-esteem.                            Collateral Involvement: Husband is very supportive. Individual's Strengths: Pt is very insightful. Type of Services Patient Feels Are Needed: MH-IOP  Mental Health Symptoms Depression:  Depression: Change in energy/activity, Difficulty Concentrating, Hopelessness, Increase/decrease in appetite, Irritability, Tearfulness, Sleep (too much or little)  Mania:  Mania: N/A  Anxiety:   Anxiety: Worrying  Psychosis:  Psychosis: N/A  Trauma:  Trauma: N/A  Obsessions:  Obsessions: N/A  Compulsions:     Inattention:     Hyperactivity/Impulsivity:     Oppositional/Defiant Behaviors:     Borderline Personality:     Other Mood/Personality Symptoms:      Mental Status Exam Appearance and self-care  Stature:  Stature: Average  Weight:  Weight: Average weight  Clothing:  Clothing: Casual  Grooming:  Grooming: Normal  Cosmetic use:  Cosmetic Use: None  Posture/gait:  Posture/Gait: Normal  Motor activity:  Motor Activity: Not Remarkable  Sensorium  Attention:  Attention: Normal  Concentration:  Concentration: Normal  Orientation:  Orientation: X5  Recall/memory:  Recall/Memory: Normal  Affect and Mood  Affect:  Affect: Depressed  Mood:  Mood: Anxious  Relating  Eye contact:  Eye Contact: Normal  Facial expression:  Facial Expression: Sad  Attitude toward examiner:  Attitude Toward Examiner: Cooperative  Thought and Language  Speech flow: Speech Flow: Normal  Thought content:  Thought Content: Appropriate to mood and circumstances  Preoccupation:     Hallucinations:      Organization:     Transport planner of Knowledge:  Fund of Knowledge: Average  Intelligence:  Intelligence: Average  Abstraction:  Abstraction: Normal  Judgement:  Judgement: Normal  Reality Testing:  Reality Testing: Adequate  Insight:  Insight: Good  Decision Making:  Decision Making: Normal  Social Functioning  Social Maturity:  Social Maturity: Isolates  Social Judgement:  Social Judgement: Normal  Stress  Stressors:  Stressors: Brewing technologist, Work, Psychologist, forensic Ability:  Coping Ability: English as a second language teacher Deficits:     Supports:      Family and Psychosocial History: Family history Marital status: Married Number of Years Married: 3 What types of issues is patient dealing with in the relationship?: n/a Additional relationship information: Was married to a male; but she left the marriage on April Fool's Day in 1996.  "She stated she didn't want to be married to me any longer." What is your sexual orientation?: homosexual Does patient have children?: No (hx of fostering kids)  Childhood History:  Childhood History By whom was/is the patient raised?: Both parents Additional childhood history information: Born in [place], [place].  "Good childhood."  Reports father being his best friend.  "I have codenpendency issues since childhood.  It's not that I didn't want to play with the other kids, but we just didn't have anything in common."  Pt describes himself as being a "loner" in school.  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  Denies any trauma or abuse. Description of patient's relationship with caregiver when they were a child: Very close to father Patient's description of current relationship with people who raised him/her: close to parents Does patient have siblings?: Yes Number of Siblings: 2 Description of patient's current relationship with siblings: Both sisters resides near [place], [place].  According to pt, he's really not close to either one.  one is very conservative and the  other is racist. Did patient suffer any verbal/emotional/physical/sexual abuse as a child?: No Did patient suffer from severe childhood neglect?: No Has patient ever been sexually abused/assaulted/raped as an adolescent or adult?: No Was the patient ever a victim of a crime or a disaster?: No Witnessed domestic violence?: No Has patient been effected by domestic violence as an adult?: No  CCA Part Two B  Employment/Work Situation: Employment / Work Copywriter, advertising Employment situation: Unemployed What is the longest time patient has a held a job?: 32 yrs Where was the patient employed at that time?: [employer] in [place] Has patient ever been in the [employer]?: No Has patient ever served in combat?: No Did You Receive Any Psychiatric Treatment/Services While in Passenger transport manager?: No Are There Guns or Other Weapons in [place]?: No Are These Psychologist, educational?:  (n/a)  Education: Education Did Teacher, adult education From [employer]?: Yes Did Physicist, medical?: Yes What Type of College Degree Do you Have?: BS Did [place]?: No 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 Did You Have An Individualized Education Program (IIEP): No Did You Have Any Difficulty At School?: No  Religion:  Religion/Spirituality Are You A Religious Person?: Yes What is Your Religious Affiliation?: Christian  Leisure/Recreation: Leisure / Recreation Leisure and Hobbies: Woodworking, model trains, photography  Exercise/Diet: Exercise/Diet Do You Exercise?: No Have You Gained or Lost A Significant Amount of Weight in the Past Six Months?: Yes-Lost Number of Pounds Lost?: 10 Do You Follow a Special Diet?: No Do You Have Any Trouble Sleeping?: Yes Explanation of Sleeping Difficulties: Getting to sleep  CCA Part Two C  Alcohol/Drug Use: Alcohol / Drug Use Pain Medications: Pt denies abuse Prescriptions: Pt denies abuse Over the Counter: Pt denies abuse History of alcohol / drug use?: No history of alcohol  / drug abuse                      CCA Part Three  ASAM's:  Six Dimensions of Multidimensional Assessment  Dimension 1:  Acute Intoxication and/or Withdrawal Potential:     Dimension 2:  Biomedical Conditions and Complications:     Dimension 3:  Emotional, Behavioral, or Cognitive Conditions and Complications:     Dimension 4:  Readiness to Change:     Dimension 5:  Relapse, Continued use, or Continued Problem Potential:     Dimension 6:  Recovery/Living Environment:      Substance use Disorder (SUD)    Social Function:  Social Functioning Social Maturity: Isolates Social Judgement: Normal  Stress:  Stress Stressors: Grief/losses, Work, Barista Ability: Overwhelmed Patient Takes Medications The Way The Doctor Instructed?: Yes Priority Risk: High Risk  Risk Assessment- Self-Harm Potential: Risk Assessment For Self-Harm Potential Thoughts of Self-Harm: Vague current thoughts (able to contract for safety) Method: No plan Availability of Means: No access/NA  Risk Assessment -Dangerous to Others Potential: Risk Assessment For Dangerous to Others Potential Method: No Plan Availability of Means: No access or NA Intent: Vague intent or NA Notification Required: No need or identified person  DSM5 Diagnoses: Patient Active Problem List   Diagnosis Date Noted  . Severe recurrent major depression without psychotic features ([place]) 01/20/2016    Class: Chronic  . Panic attack 01/03/2016  . Generalized anxiety disorder 01/03/2016  . Vitamin D deficiency 11/16/2015  . Bipolar disorder with depression ([place]) 11/01/2015  . Insomnia 11/01/2015  . Right shoulder pain 11/01/2015  . Low testosterone 11/01/2015  . Hypogonadism in male 11/01/2015  . Low bone density 11/01/2015    Patient Centered Plan: Patient is on the following Treatment Plan(s):  Depression  Recommendations for Services/Supports/Treatments: Recommendations for  Services/Supports/Treatments Recommendations For Services/Supports/Treatments: IOP (Intensive Outpatient Program)  Treatment Plan Summary:  Pt will participate in [place] on a daily basis; to learn effective coping skills.  F/U with Isabelle Course, [facility] and a psychiatrist.  Encouraged support groups.  Referral to Voc Rehab.  Referrals to Alternative Service(s): Referred to Alternative Service(s):   Place:   Date:   Time:    Referred to Alternative Service(s):   Place:   Date:   Time:    Referred to Alternative Service(s):   Place:   Date:   Time:    Referred to Alternative Service(s):   Place:   Date:   Time:     CLARK, RITA, M.Ed, CNA
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
## 2016-01-21 ENCOUNTER — Other Ambulatory Visit (HOSPITAL_COMMUNITY): Payer: BLUE CROSS/BLUE SHIELD | Admitting: Psychiatry

## 2016-01-21 DIAGNOSIS — F332 Major depressive disorder, recurrent severe without psychotic features: Secondary | ICD-10-CM

## 2016-01-21 DIAGNOSIS — F419 Anxiety disorder, unspecified: Secondary | ICD-10-CM | POA: Diagnosis not present

## 2016-01-21 DIAGNOSIS — G4700 Insomnia, unspecified: Secondary | ICD-10-CM | POA: Diagnosis not present

## 2016-01-21 DIAGNOSIS — F319 Bipolar disorder, unspecified: Secondary | ICD-10-CM | POA: Diagnosis not present

## 2016-01-21 DIAGNOSIS — G43909 Migraine, unspecified, not intractable, without status migrainosus: Secondary | ICD-10-CM | POA: Diagnosis not present

## 2016-01-21 NOTE — Progress Notes (Signed)
    Daily Group Progress Note  Program: IOP  Group Time: 9:00-12:00  Participation Level: Active  Behavioral Response: Appropriate  Type of Therapy:  Group Therapy  Summary of Progress: Pt.'s first day in group, was mostly quiet and observant of group process. Pt. Met with case manager and psychiatrist. Pt. Participated in session facilitated by the mental health association and became appropriately tearful when discussing his need for social support.      Nancie Neas, LPC

## 2016-01-24 ENCOUNTER — Other Ambulatory Visit (HOSPITAL_COMMUNITY): Payer: Self-pay

## 2016-01-24 ENCOUNTER — Other Ambulatory Visit (HOSPITAL_COMMUNITY): Payer: BLUE CROSS/BLUE SHIELD | Admitting: Psychiatry

## 2016-01-24 DIAGNOSIS — F5101 Primary insomnia: Secondary | ICD-10-CM

## 2016-01-24 DIAGNOSIS — F332 Major depressive disorder, recurrent severe without psychotic features: Secondary | ICD-10-CM | POA: Diagnosis not present

## 2016-01-24 DIAGNOSIS — G4700 Insomnia, unspecified: Secondary | ICD-10-CM | POA: Diagnosis not present

## 2016-01-24 DIAGNOSIS — F419 Anxiety disorder, unspecified: Secondary | ICD-10-CM | POA: Diagnosis not present

## 2016-01-24 DIAGNOSIS — F319 Bipolar disorder, unspecified: Secondary | ICD-10-CM | POA: Diagnosis not present

## 2016-01-24 DIAGNOSIS — G43909 Migraine, unspecified, not intractable, without status migrainosus: Secondary | ICD-10-CM | POA: Diagnosis not present

## 2016-01-24 MED ORDER — TRAZODONE HCL 50 MG PO TABS: 50.0000 mg | ORAL_TABLET | Freq: Every day | ORAL | 0 refills | Status: DC

## 2016-01-24 MED ORDER — GABAPENTIN 300 MG PO CAPS: 300.0000 mg | ORAL_CAPSULE | Freq: Every day | ORAL | 0 refills | Status: DC

## 2016-01-24 NOTE — Progress Notes (Signed)
90 day supply sent to the pharmacy of patients Gabapentin and Trazodone - insurance will only cover 90 day. These were called in by Dr. Lovena Le last week

## 2016-01-25 ENCOUNTER — Other Ambulatory Visit (HOSPITAL_COMMUNITY): Payer: BLUE CROSS/BLUE SHIELD | Admitting: Psychiatry

## 2016-01-25 DIAGNOSIS — F332 Major depressive disorder, recurrent severe without psychotic features: Secondary | ICD-10-CM

## 2016-01-25 DIAGNOSIS — F319 Bipolar disorder, unspecified: Secondary | ICD-10-CM | POA: Diagnosis not present

## 2016-01-25 DIAGNOSIS — F419 Anxiety disorder, unspecified: Secondary | ICD-10-CM | POA: Diagnosis not present

## 2016-01-25 DIAGNOSIS — G4700 Insomnia, unspecified: Secondary | ICD-10-CM | POA: Diagnosis not present

## 2016-01-25 DIAGNOSIS — G43909 Migraine, unspecified, not intractable, without status migrainosus: Secondary | ICD-10-CM | POA: Diagnosis not present

## 2016-01-25 MED ORDER — CLONAZEPAM 0.5 MG PO TABS: 0.5000 mg | ORAL_TABLET | Freq: Two times a day (BID) | ORAL | 0 refills | Status: DC | PRN

## 2016-01-25 NOTE — Telephone Encounter (Signed)
Called in the clonazepam but did not record the note in th medications list so corrected it today

## 2016-01-25 NOTE — Progress Notes (Signed)
    Daily Group Progress Note  Program: IOP Group Time: 9:00-12:00   Participation Level: active   Behavioral Response: engaged/responsive   Type of Therapy:   group therapy   Summary of Progress:  Patient reported having a difficult morning, and that feelings of sadness were leading to excessive tearfulness. 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 Counselor validated his upset, but also offered a reframe: while he did not receive the job he has received clarity about what he wants his next career to be like. Counselor also suggested volunteering as a way to break the isolation he's experiencing during the job search. Patient seemed both skeptical and thoughtful around this idea.    Nancie Neas, LPC

## 2016-01-26 ENCOUNTER — Other Ambulatory Visit (HOSPITAL_COMMUNITY): Payer: BLUE CROSS/BLUE SHIELD | Admitting: Psychiatry

## 2016-01-26 ENCOUNTER — Encounter: Payer: Self-pay | Admitting: Medical

## 2016-01-26 DIAGNOSIS — G43909 Migraine, unspecified, not intractable, without status migrainosus: Secondary | ICD-10-CM | POA: Diagnosis not present

## 2016-01-26 DIAGNOSIS — F419 Anxiety disorder, unspecified: Secondary | ICD-10-CM | POA: Diagnosis not present

## 2016-01-26 DIAGNOSIS — G4700 Insomnia, unspecified: Secondary | ICD-10-CM | POA: Diagnosis not present

## 2016-01-26 DIAGNOSIS — F332 Major depressive disorder, recurrent severe without psychotic features: Secondary | ICD-10-CM

## 2016-01-26 DIAGNOSIS — F319 Bipolar disorder, unspecified: Secondary | ICD-10-CM | POA: Diagnosis not present

## 2016-01-26 NOTE — Progress Notes (Signed)
    Daily Group Progress Note  Program: IOP  Group Time: 9:00-12:00   Participation Level: active   Behavioral Response: engaged/responsive   Type of Therapy:   group therapy   Summary of Progress:  Patient reported crippling morning anxiety. Counselor encourage client to search for awareness of what precedes his anxiety. Patient fears not being able to find employment, and falling into tax debt. Patient made some connection to how his thoughts tend to spin before he becomes anxious, and challenged the reality of some of his cognitions with encouragement from the group. Pt. Participated in medication management group with the pharmacist.  Nancie Neas, [facility]

## 2016-01-27 ENCOUNTER — Other Ambulatory Visit (HOSPITAL_COMMUNITY): Payer: BLUE CROSS/BLUE SHIELD | Admitting: Psychiatry

## 2016-01-27 ENCOUNTER — Telehealth: Payer: Self-pay | Admitting: Medical

## 2016-01-27 ENCOUNTER — Other Ambulatory Visit: Payer: Self-pay | Admitting: Medical

## 2016-01-27 DIAGNOSIS — G4700 Insomnia, unspecified: Secondary | ICD-10-CM | POA: Diagnosis not present

## 2016-01-27 DIAGNOSIS — F419 Anxiety disorder, unspecified: Secondary | ICD-10-CM | POA: Diagnosis not present

## 2016-01-27 DIAGNOSIS — F332 Major depressive disorder, recurrent severe without psychotic features: Secondary | ICD-10-CM | POA: Diagnosis not present

## 2016-01-27 DIAGNOSIS — F411 Generalized anxiety disorder: Secondary | ICD-10-CM

## 2016-01-27 DIAGNOSIS — R002 Palpitations: Secondary | ICD-10-CM

## 2016-01-27 DIAGNOSIS — F319 Bipolar disorder, unspecified: Secondary | ICD-10-CM | POA: Diagnosis not present

## 2016-01-27 DIAGNOSIS — G43909 Migraine, unspecified, not intractable, without status migrainosus: Secondary | ICD-10-CM | POA: Diagnosis not present

## 2016-01-27 NOTE — Telephone Encounter (Signed)
This is an [place] - This patient will come in today or tomorrow for lab visit/nurse visit.

## 2016-01-27 NOTE — Progress Notes (Signed)
    Daily Group Progress Note  Program: IOP  Group Time: 9:00-12:00   Participation Level:  active  Behavioral Response:  responsive   Type of Therapy:   group therapy   Summary of Progress:  Patient was very upset about the intensity of his anxiety this morning. He was expressing fear that it would never cease. Counselor challenged the patient to sit with his anxiety, and to recall times when he did not feel so anxious. Patient expressed a lot of fears around never getting a job, and being left by his husband. However, he responded positively CBT exercise where we wrote and illustrated our negative thoughts then wrote and illustrated a challenge to that thought.  Nancie Neas, LPC

## 2016-01-28 ENCOUNTER — Other Ambulatory Visit: Payer: BLUE CROSS/BLUE SHIELD

## 2016-01-28 ENCOUNTER — Other Ambulatory Visit (HOSPITAL_COMMUNITY): Payer: BLUE CROSS/BLUE SHIELD | Admitting: Psychiatry

## 2016-01-28 DIAGNOSIS — F332 Major depressive disorder, recurrent severe without psychotic features: Secondary | ICD-10-CM

## 2016-01-28 DIAGNOSIS — F419 Anxiety disorder, unspecified: Secondary | ICD-10-CM | POA: Diagnosis not present

## 2016-01-28 DIAGNOSIS — F411 Generalized anxiety disorder: Secondary | ICD-10-CM

## 2016-01-28 DIAGNOSIS — F319 Bipolar disorder, unspecified: Secondary | ICD-10-CM | POA: Diagnosis not present

## 2016-01-28 DIAGNOSIS — R002 Palpitations: Secondary | ICD-10-CM

## 2016-01-28 DIAGNOSIS — G4700 Insomnia, unspecified: Secondary | ICD-10-CM | POA: Diagnosis not present

## 2016-01-28 DIAGNOSIS — G43909 Migraine, unspecified, not intractable, without status migrainosus: Secondary | ICD-10-CM | POA: Diagnosis not present

## 2016-01-28 LAB — T4, FREE: Free T4: 1.2 ng/dL (ref 0.8–1.8)

## 2016-01-28 LAB — TSH: TSH: 1.06 m[IU]/L (ref 0.40–4.50)

## 2016-01-31 ENCOUNTER — Other Ambulatory Visit (HOSPITAL_COMMUNITY): Payer: BLUE CROSS/BLUE SHIELD | Admitting: Psychiatry

## 2016-01-31 DIAGNOSIS — G43909 Migraine, unspecified, not intractable, without status migrainosus: Secondary | ICD-10-CM | POA: Diagnosis not present

## 2016-01-31 DIAGNOSIS — F332 Major depressive disorder, recurrent severe without psychotic features: Secondary | ICD-10-CM | POA: Diagnosis not present

## 2016-01-31 DIAGNOSIS — F319 Bipolar disorder, unspecified: Secondary | ICD-10-CM | POA: Diagnosis not present

## 2016-01-31 DIAGNOSIS — F419 Anxiety disorder, unspecified: Secondary | ICD-10-CM | POA: Diagnosis not present

## 2016-01-31 DIAGNOSIS — G4700 Insomnia, unspecified: Secondary | ICD-10-CM | POA: Diagnosis not present

## 2016-01-31 NOTE — Progress Notes (Signed)
    Daily Group Progress Note  Program: IOP  Group Time: 9:00-12:00   Participation Level: active  Behavioral Response:  responsive   Type of Therapy:   group therapy   Summary of Progress:  Patient was once again very upset about the intensity of his anxiety this morning. Patient expressed suicidal ideation, and the counselor unpacked this with him-and helped him to reframe his desire as one that is more about making the pain stop, rather than wanting to die.    Nancie Neas, LPC

## 2016-01-31 NOTE — Progress Notes (Signed)
    Daily Group Progress Note  Group Time: 9:00-12:00   Participation Level:  active   Behavioral Response:  responsive   Type of Therapy:   group therapy   Summary of Progress:  Patient continues to struggle with crippling morning anxiety. He expressed he wishes it would just go away, and feels confused by it's appearance in his life-since this is a new phenomena for him, which occurs seemingly at random. Counselor encouraged client to stop fighting the feeling-as this is likely creating more resistance to alleviating his anxiety. Patient felt it was hard to grasp this concept, so wrote it down. He's been out of work for the past year, after working for 32 years in a row. The counselor presented the idea that this change in routine is likely contributing to his current anxiety. Patient seemed to connect with this. Counselor encouraged the client to find a behavioral "anchor" in his day-something which can help him establish a new routine.   Nancie Neas, LPC

## 2016-02-01 ENCOUNTER — Other Ambulatory Visit (HOSPITAL_COMMUNITY): Payer: BLUE CROSS/BLUE SHIELD | Admitting: Psychiatry

## 2016-02-01 DIAGNOSIS — F419 Anxiety disorder, unspecified: Secondary | ICD-10-CM | POA: Diagnosis not present

## 2016-02-01 DIAGNOSIS — F332 Major depressive disorder, recurrent severe without psychotic features: Secondary | ICD-10-CM

## 2016-02-01 DIAGNOSIS — G4700 Insomnia, unspecified: Secondary | ICD-10-CM | POA: Diagnosis not present

## 2016-02-01 DIAGNOSIS — G43909 Migraine, unspecified, not intractable, without status migrainosus: Secondary | ICD-10-CM | POA: Diagnosis not present

## 2016-02-01 DIAGNOSIS — F319 Bipolar disorder, unspecified: Secondary | ICD-10-CM | POA: Diagnosis not present

## 2016-02-01 NOTE — Progress Notes (Signed)
    Daily Group Progress Note  Program: IOP  Group Time: 9:00-12:00  Participation Level: Active  Behavioral Response: Appropriate  Type of Therapy:  Group Therapy  Summary of Progress: Pt. Reports that he continues to wake up with anxiety, but feels good for most of the afternoon and evening. Counselor worked with Pt. Regarding accepting the "anxious part of himself" and that developing acceptance instead of resistance to diagnosis is important part of the recovery process. Pt. Participated in discussion about use of grounding series to assist with managing of anxiety and depression.     Nancie Neas, LPC

## 2016-02-02 ENCOUNTER — Other Ambulatory Visit (HOSPITAL_COMMUNITY): Payer: BLUE CROSS/BLUE SHIELD | Attending: Psychiatry | Admitting: Psychiatry

## 2016-02-02 DIAGNOSIS — F332 Major depressive disorder, recurrent severe without psychotic features: Secondary | ICD-10-CM

## 2016-02-02 DIAGNOSIS — F411 Generalized anxiety disorder: Secondary | ICD-10-CM | POA: Insufficient documentation

## 2016-02-02 NOTE — Progress Notes (Signed)
    Daily Group Progress Note  Program: IOP  Group Time: 9:00-12:00   Participation Level:  active   Behavioral Response:  responsive   Type of Therapy:   group therapy   Summary of Progress:  Patient reports feeling better. His anxiety has lessened, and he has been using the strategies provided in group. He's been self soothing by telling himself to relax and utilizing deep breathing. However, the patient noticed his anxiety begins around 9am and extends until 5pm, after which he feels relaxed. Counselor reminded him that this was his work schedule for 32 years, and suggested that the anxiety he feels now was a tool for him to get things done when working. This resonated with the patient. He felt proud to have not taken any Klonopin today.   Nancie Neas, LPC

## 2016-02-03 ENCOUNTER — Other Ambulatory Visit (HOSPITAL_COMMUNITY): Payer: BLUE CROSS/BLUE SHIELD | Admitting: Psychiatry

## 2016-02-03 DIAGNOSIS — F332 Major depressive disorder, recurrent severe without psychotic features: Secondary | ICD-10-CM

## 2016-02-03 DIAGNOSIS — F411 Generalized anxiety disorder: Secondary | ICD-10-CM | POA: Diagnosis not present

## 2016-02-04 ENCOUNTER — Other Ambulatory Visit (HOSPITAL_COMMUNITY): Payer: BLUE CROSS/BLUE SHIELD | Admitting: Psychiatry

## 2016-02-04 DIAGNOSIS — F332 Major depressive disorder, recurrent severe without psychotic features: Secondary | ICD-10-CM | POA: Diagnosis not present

## 2016-02-04 DIAGNOSIS — F411 Generalized anxiety disorder: Secondary | ICD-10-CM | POA: Diagnosis not present

## 2016-02-04 NOTE — Progress Notes (Signed)
    Daily Group Progress Note  Program: IOP Group Time: 9:00-12:00   Participation Level: active   Behavioral Response: engaged   Type of Therapy:   group therapy   Summary of Progress: Patient was alert and active during group.  The patient discussed the "fogginess" related to his anxiety and fears about having bad days after having numerous good days.  Counselor encouraged him to try grounding techniques during his "foggy" times.  Counselor and group members normalized having bad days after having numerous good days.  Counselor and group members discussed positive psychology after watching a "The Positive Advantage" TedTalk.  Patient stated he would try some techniques provided in the video.  Counselor and group members discussed what image is portrayed on the outside versus what is felt on the inside.  Counselor and group discussed "levels" of sharing and trust.   Nancie Neas, LPC

## 2016-02-07 ENCOUNTER — Other Ambulatory Visit (HOSPITAL_COMMUNITY): Payer: BLUE CROSS/BLUE SHIELD | Admitting: Psychiatry

## 2016-02-07 DIAGNOSIS — F332 Major depressive disorder, recurrent severe without psychotic features: Secondary | ICD-10-CM | POA: Diagnosis not present

## 2016-02-07 DIAGNOSIS — F411 Generalized anxiety disorder: Secondary | ICD-10-CM | POA: Diagnosis not present

## 2016-02-08 ENCOUNTER — Other Ambulatory Visit (HOSPITAL_COMMUNITY): Payer: BLUE CROSS/BLUE SHIELD | Admitting: Psychiatry

## 2016-02-08 DIAGNOSIS — F411 Generalized anxiety disorder: Secondary | ICD-10-CM | POA: Diagnosis not present

## 2016-02-08 DIAGNOSIS — F332 Major depressive disorder, recurrent severe without psychotic features: Secondary | ICD-10-CM

## 2016-02-08 MED ORDER — CLONAZEPAM 1 MG PO TABS: 1.0000 mg | ORAL_TABLET | Freq: Two times a day (BID) | ORAL | 0 refills | Status: DC

## 2016-02-08 NOTE — Progress Notes (Signed)
    Daily Group Progress Note  Program: IOP  Group Time: 9:00-12:00   Participation Level: active   Behavioral Response:  responsive   Type of Therapy:   group therapy   Summary of Progress:  Patient continues to feel better, though his anxiety ebbs and then returns. However, his perspective is much more positive, in that he realizes he has to learn to manage it, and that it is not going to disappear over night. He's feeling anxious, but also excited because he has a job interview on Monday. When discussing grief and loss, patient shared that he fears losing the people closest to him, and he fears being a burden.   Nancie Neas, LPC

## 2016-02-08 NOTE — Progress Notes (Signed)
    Daily Group Progress Note     Program: IOP   Group Time: 9:00-12:00   Participation Level:  active   Behavioral Response:  responsive   Type of Therapy:   group therapy   Summary of Progress:  Patient reports feeling sleepy this morning. Shared that he awakened at 4:30 am with feelings of anxiety. He shared that his anxiety has increased at night. He has been using the strategies provided in group (breathing exercises, utilizing social supports, etc.). He also continues to utilize "self talk" telling himself to relax. He has also found deep breathing to be effective. However, the patient shares that his medications may need to be reviewed as this may be the result of increased anxiety and night. Patient requested medication management today. He was observed to be more relaxed learning that the psychiatrist would discuss medication recommendations with him today. Counselor reminded patient that not all anxiety is "bad anxiety".  Patient agreed and found a positive side to his anxiety stating that it "produces motivation".  This resonated with the patient. He shared with the group that he has a job interview next Tuesday and although he is anxious about the interview he feels a sense of happiness and motivation.   Nancie Neas, LPC

## 2016-02-08 NOTE — Progress Notes (Signed)
    Daily Group Progress Note  Program: IOP Group Time: 9:00-12:00   Participation Level:  active   Behavioral Response:  responsive   Type of Therapy:   group therapy   Summary of Progress: Patient reported changing his anxiety medication, and feeling a lot better because of it. Over all he's feeling better, and feels relieved, as he is discharging on Friday. Another group member suggested to him that finding social connection could really help him after he's discharged, as he spends a lot of time home alone. Patient was responsive to this thought.    Nancie Neas, LPC

## 2016-02-09 ENCOUNTER — Other Ambulatory Visit (HOSPITAL_COMMUNITY): Payer: BLUE CROSS/BLUE SHIELD | Admitting: Psychiatry

## 2016-02-09 DIAGNOSIS — F332 Major depressive disorder, recurrent severe without psychotic features: Secondary | ICD-10-CM

## 2016-02-09 DIAGNOSIS — F411 Generalized anxiety disorder: Secondary | ICD-10-CM | POA: Diagnosis not present

## 2016-02-09 NOTE — Progress Notes (Signed)
Patient ID: Jesse Hardin, male   DOB: [birth year], 55 y.o.   MRN: [id number] [facility] IOP DISCHARGE NOTE  Patient:  Jesse Hardin DOB:  [birth year]  Date of Admission: 01/20/2016  Date of Discharge:02/09/2016   Reason for Admission:depression and anxiety  IOP Course:attended and participated.  Slow progress but left feeling more optimistic and less anxious.  Clonazepam was increased to 1 mg bid and that was helpful.  Group was very helpful he said in giving him better coping skills  Mental Status at [place] and anxiety remain but no suicidal ideation  Diagnosis: major depression, recurrent severe without psychosis.  Generalized anxiety disorder  Level of Care:  IOP  Discharge destination: return to outpatient therapist and psychiatrist and appointments have been made     Comments:  none  The patient received suicide prevention pamphlet:  Yes   Donnelly Angelica, MD

## 2016-02-09 NOTE — Patient Instructions (Signed)
D:  Patient completed MH-IOP today.  A:  Discharge today.  Follow up with Metta Clines, NP and Isabelle Course, LPC.  Encouraged support groups.  R:  Pt receptive.

## 2016-02-09 NOTE — Progress Notes (Signed)
Jesse Hardin is a 55 y.o. , married, unemployed male; who was referred per TTS; treatment for worsening depressive and anxiety symptoms.  Denies any prior suicide attempts or gestures.  Reports prior MH-IOP treatment in [place] after the divorce from wife in 1996.  Pt has been seeing therapist Deneise Lever Hadgkiss, LCSW) for ~ three visits.  Pt reports he has been feeling increasingly depressed over the last several weeks due to losing a job opportunity. Pt reports he was "really excited and happy" about the job and when he learned he did not get it, he became depressed. Pt reports he has been waking up feeling "cloudy" and does not desire to get out of bed. Pt reports his husband was out of town which was another stressor for him; but husband cut business trip short to come be home with pt.   Pt reports he has been feeling sad for no reason and he cannot explain or understand why.   Pt did voice having anxiety about his finances.  Before moving to [place] from [place], he cashed out his retirement fund in Jan. 2017.  The couple moved to [place] due to husband's job.  Pt reports he had a recent change in his medications (PCP increased the Lexapro two weeks ago to 20 mg; and started him on Risperdal )and he feels this may be a contributing factor to his increasing depression. Pt is requesting to start back on Wellbutrin; which helped him in the past.  Family HX:  Mother and Sister (Depression). Pt completed MH-IOP today.  Denies SI/HI or A/V hallucinations.  Reports feeling more hopeful.  Applying coping skills learned.  A:  D/C today.  F/U with Metta Clines, NP and Isabelle Course, LPC.  Encouraged support groups.  Recommended [facility].  R:  Pt receptive.     Carlis Abbott, RITA, M.Ed, CNA

## 2016-02-10 ENCOUNTER — Other Ambulatory Visit (HOSPITAL_COMMUNITY): Payer: BLUE CROSS/BLUE SHIELD | Admitting: Psychiatry

## 2016-02-11 ENCOUNTER — Other Ambulatory Visit (HOSPITAL_COMMUNITY): Payer: BLUE CROSS/BLUE SHIELD

## 2016-02-14 ENCOUNTER — Other Ambulatory Visit (HOSPITAL_COMMUNITY): Payer: BLUE CROSS/BLUE SHIELD

## 2016-02-14 NOTE — Progress Notes (Signed)
    Daily Group Progress Note  Program: IOP  Group Time: 9:00-12:00   Participation Level: minimal    Behavioral Response:  responsive   Type of Therapy:   group therapy   Summary of Progress:  Pt has an interview this afternoon with a local company. He is very excited, but also nervous. He is maintaining optimism about the interview. Counselor suggested that pt use power posing before his interview, as it has been clinically shown to increase endorphins and lower stress hormones.  Nancie Neas, LPC

## 2016-02-14 NOTE — Progress Notes (Signed)
    Daily Group Progress Note  Program: IOP  Group Time: 9:00-12:00   Participation Level: minimal    Behavioral Response: responsive   Type of Therapy:   group therapy   Summary of Progress:  Patient is feeling a persistent sense of worry about his taxes which will be due on the 401K he cashed in when he and his husband moved to [place]. Patient wanted to know how to stop his anxious ruminations. Counselor did some psychoeducation around mindfully acknowledging what the patient is feeling, and allowing his thoughts to exist without resisting them. The group also chimed in and suggested the patient contact the IRS and set up a payment plan. Patient responded positively to all suggestions.   Nancie Neas, LPC

## 2016-02-14 NOTE — Progress Notes (Signed)
    Daily Group Progress Note  Program: IOP  Group Time: 9:00-12:00   Participation Level:  minimal    Behavioral Response:  responsive   Type of Therapy:  group therapy   Summary of Progress:  Today is the pt's discharge day. He shared about feeling excited and positive that his job interview the previous day went well. However, pt shared that he realizes he may not get the job, and if he doesn't that is okay with him because he believes another good job will come his way. This is a marked shift in perspective from when the pt began the group.   Nancie Neas, LPC

## 2016-02-15 ENCOUNTER — Other Ambulatory Visit (HOSPITAL_COMMUNITY): Payer: BLUE CROSS/BLUE SHIELD

## 2016-02-15 ENCOUNTER — Encounter: Payer: Self-pay | Admitting: Medical

## 2016-02-16 ENCOUNTER — Other Ambulatory Visit (HOSPITAL_COMMUNITY): Payer: BLUE CROSS/BLUE SHIELD

## 2016-02-17 ENCOUNTER — Encounter: Payer: Self-pay | Admitting: Medical

## 2016-02-17 ENCOUNTER — Other Ambulatory Visit (HOSPITAL_COMMUNITY): Payer: BLUE CROSS/BLUE SHIELD

## 2016-02-18 ENCOUNTER — Other Ambulatory Visit (HOSPITAL_COMMUNITY): Payer: BLUE CROSS/BLUE SHIELD

## 2016-02-21 ENCOUNTER — Other Ambulatory Visit (HOSPITAL_COMMUNITY): Payer: BLUE CROSS/BLUE SHIELD

## 2016-02-22 ENCOUNTER — Other Ambulatory Visit (HOSPITAL_COMMUNITY): Payer: BLUE CROSS/BLUE SHIELD

## 2016-02-23 ENCOUNTER — Other Ambulatory Visit (HOSPITAL_COMMUNITY): Payer: BLUE CROSS/BLUE SHIELD

## 2016-02-25 ENCOUNTER — Other Ambulatory Visit (HOSPITAL_COMMUNITY): Payer: BLUE CROSS/BLUE SHIELD

## 2016-02-28 ENCOUNTER — Other Ambulatory Visit (HOSPITAL_COMMUNITY): Payer: BLUE CROSS/BLUE SHIELD

## 2016-02-29 ENCOUNTER — Other Ambulatory Visit (HOSPITAL_COMMUNITY): Payer: BLUE CROSS/BLUE SHIELD

## 2016-03-01 ENCOUNTER — Other Ambulatory Visit (HOSPITAL_COMMUNITY): Payer: BLUE CROSS/BLUE SHIELD

## 2016-03-02 ENCOUNTER — Other Ambulatory Visit (HOSPITAL_COMMUNITY): Payer: BLUE CROSS/BLUE SHIELD

## 2016-03-03 ENCOUNTER — Other Ambulatory Visit (HOSPITAL_COMMUNITY): Payer: BLUE CROSS/BLUE SHIELD

## 2016-03-06 ENCOUNTER — Other Ambulatory Visit (HOSPITAL_COMMUNITY): Payer: BLUE CROSS/BLUE SHIELD

## 2016-03-06 DIAGNOSIS — F3181 Bipolar II disorder: Secondary | ICD-10-CM | POA: Diagnosis not present

## 2016-03-07 ENCOUNTER — Other Ambulatory Visit (HOSPITAL_COMMUNITY): Payer: BLUE CROSS/BLUE SHIELD

## 2016-03-08 ENCOUNTER — Other Ambulatory Visit (HOSPITAL_COMMUNITY): Payer: BLUE CROSS/BLUE SHIELD

## 2016-03-09 ENCOUNTER — Other Ambulatory Visit (HOSPITAL_COMMUNITY): Payer: BLUE CROSS/BLUE SHIELD

## 2016-03-10 ENCOUNTER — Other Ambulatory Visit (HOSPITAL_COMMUNITY): Payer: BLUE CROSS/BLUE SHIELD

## 2016-03-13 ENCOUNTER — Other Ambulatory Visit (HOSPITAL_COMMUNITY): Payer: BLUE CROSS/BLUE SHIELD

## 2016-03-14 ENCOUNTER — Encounter: Payer: Self-pay | Admitting: Medical

## 2016-03-14 ENCOUNTER — Other Ambulatory Visit (HOSPITAL_COMMUNITY): Payer: BLUE CROSS/BLUE SHIELD

## 2016-03-15 ENCOUNTER — Ambulatory Visit (HOSPITAL_COMMUNITY): Payer: Self-pay | Admitting: Clinical

## 2016-03-15 ENCOUNTER — Other Ambulatory Visit (HOSPITAL_COMMUNITY): Payer: BLUE CROSS/BLUE SHIELD

## 2016-03-20 DIAGNOSIS — F3181 Bipolar II disorder: Secondary | ICD-10-CM | POA: Diagnosis not present

## 2016-04-05 ENCOUNTER — Telehealth: Payer: Self-pay | Admitting: Family Medicine

## 2016-04-05 NOTE — Telephone Encounter (Signed)
Called spoke with him gave him this information . I told him that the if he gets worse to call us back .

## 2016-04-05 NOTE — Telephone Encounter (Signed)
Sorry to hear they got sick.    Typical flu symptoms are body aches, fever, chills, cough, congestion, sore throat, headache, etc.   If they have had symptoms for over 48 hours the Tamilfu wont help.  The notes said 3 days.   Certainly the recommendations include REST, hydration, can use Tylenol or Ibuprofen for fever/aches/chills.  Flu can take 7- 10 days to resolve.    However if worsening symptoms, trouble breathing, no feeling improvement gradually over the next few days then come in.  If high fever, 103+ and feeling horrible consider coming in.   But unfortunately Tamilfu wont help after 48 hours

## 2016-04-05 NOTE — Telephone Encounter (Signed)
Jesse Hardin called and said he and his spouse Jesse Hardin have been around someone for the last 5 days that was dx last night with Flu strain A.  The both have been sick for 3 days with Fever, Body Aches, Head aches, congestion and want you to call in Tamiflu to [facility] on [address] for both.  Pt ph [phone]

## 2016-04-06 ENCOUNTER — Encounter: Payer: Self-pay | Admitting: Medical

## 2016-04-06 ENCOUNTER — Ambulatory Visit (INDEPENDENT_AMBULATORY_CARE_PROVIDER_SITE_OTHER): Payer: BLUE CROSS/BLUE SHIELD | Admitting: Medical

## 2016-04-06 VITALS — BP 116/72 | HR 69 | Wt 167.8 lb

## 2016-04-06 DIAGNOSIS — R6889 Other general symptoms and signs: Secondary | ICD-10-CM | POA: Diagnosis not present

## 2016-04-06 DIAGNOSIS — J988 Other specified respiratory disorders: Secondary | ICD-10-CM | POA: Diagnosis not present

## 2016-04-06 MED ORDER — AMOXICILLIN 500 MG PO TABS: ORAL_TABLET | ORAL | 0 refills | Status: DC

## 2016-04-06 NOTE — Progress Notes (Signed)
Subjective: Chief Complaint  Patient presents with  . possible flu    possible flu , bodyaches, headaches, coughing , weakness    Jesse Hardin is a 56 y.o. male who presents for respiratory illness.  Been feeling bad for 5 days.  Started out with normal cold symptoms, but has gotten worse.  Has had clear drainage until today when he has yellow nasal congestion with some blood tinged mucous.  Has had some body aches, shoulder aches, cough, weakness, headache.   No fever, no NVD.  Has felt a little SOB.  No chills.   Has had some sore throat.   Some ear pressure.   Has recently been around someone with the flu.  Patient is not a smoker.  Using alka seltzer cold.  No other aggravating or relieving factors.  No other c/o.  Past Medical History:  Diagnosis Date  . Allergy   . Anxiety   . Bipolar disorder ([place])    "quick cycler", hx/o manic episodes if off medication  . Colon polyps 06/26/2011   Ascending colon  . Depression   . Diverticulitis of colon 06/26/2011   per colonoscopy  . Grade I internal hemorrhoids 06/26/2011   colonscopy.   . Insomnia   . Migraine   . Sexual dysfunction     ROS as in subjective   Objective: BP 116/72   Pulse 69   Wt 167 lb 12.8 oz (76.1 kg)   SpO2 98%   BMI 26.09 kg/m   General appearance: Alert, WD/WN, no distress, ill appearing                             Skin: warm, no rash                           Head: no sinus tenderness                            Eyes: conjunctiva normal, corneas clear, PERRLA                            Ears: flat TMs, external ear canals normal                          Nose: septum midline, turbinates swollen, with erythema and mucoid discharge             Mouth/throat: MMM, tongue normal, mild pharyngeal erythema                           Neck: supple, no adenopathy, no thyromegaly, non tender                      Lungs: CTA bilaterally, no wheezes, rales, or rhonchi       Assessment  Encounter Diagnoses    Name Primary?  Marland Kitchen Respiratory tract infection Yes  . Flu-like symptoms       Plan: Flu test negative.  Symptoms and exam suggest viral URI vs early sinus infection.  C/t rest, hydration, Alka Seltzer OTC remedy.   If worsening or continue colored music in the next 48 hours can begin amoxicillin, but for now c/t the supportive care he is doing.  Patient was advised to call or return if  worse or not improving in the next few days.  Patient voiced understanding of diagnosis, recommendations, and treatment plan.

## 2016-04-07 LAB — POC INFLUENZA A&B (BINAX/QUICKVUE)
INFLUENZA A, POC: NEGATIVE
INFLUENZA B, POC: NEGATIVE

## 2016-04-10 ENCOUNTER — Other Ambulatory Visit: Payer: Self-pay | Admitting: Medical

## 2016-04-18 ENCOUNTER — Encounter: Payer: Self-pay | Admitting: Medical

## 2016-05-02 DIAGNOSIS — F3181 Bipolar II disorder: Secondary | ICD-10-CM | POA: Diagnosis not present

## 2016-05-13 ENCOUNTER — Other Ambulatory Visit: Payer: Self-pay | Admitting: Medical

## 2016-05-18 DIAGNOSIS — F3181 Bipolar II disorder: Secondary | ICD-10-CM | POA: Diagnosis not present

## 2016-06-08 DIAGNOSIS — F3181 Bipolar II disorder: Secondary | ICD-10-CM | POA: Diagnosis not present

## 2016-06-23 DIAGNOSIS — F3181 Bipolar II disorder: Secondary | ICD-10-CM | POA: Diagnosis not present

## 2016-07-27 DIAGNOSIS — F3181 Bipolar II disorder: Secondary | ICD-10-CM | POA: Diagnosis not present

## 2016-09-04 DIAGNOSIS — F3181 Bipolar II disorder: Secondary | ICD-10-CM | POA: Diagnosis not present

## 2016-09-23 ENCOUNTER — Other Ambulatory Visit: Payer: Self-pay | Admitting: Medical

## 2016-09-26 DIAGNOSIS — K5792 Diverticulitis of intestine, part unspecified, without perforation or abscess without bleeding: Secondary | ICD-10-CM | POA: Diagnosis not present

## 2016-09-26 DIAGNOSIS — R1084 Generalized abdominal pain: Secondary | ICD-10-CM | POA: Diagnosis not present

## 2016-09-26 DIAGNOSIS — R3129 Other microscopic hematuria: Secondary | ICD-10-CM | POA: Diagnosis not present

## 2016-09-28 DIAGNOSIS — R3129 Other microscopic hematuria: Secondary | ICD-10-CM | POA: Diagnosis not present

## 2016-10-03 DIAGNOSIS — K5792 Diverticulitis of intestine, part unspecified, without perforation or abscess without bleeding: Secondary | ICD-10-CM | POA: Diagnosis not present

## 2016-10-03 DIAGNOSIS — D72829 Elevated white blood cell count, unspecified: Secondary | ICD-10-CM | POA: Diagnosis not present

## 2016-10-03 DIAGNOSIS — R3129 Other microscopic hematuria: Secondary | ICD-10-CM | POA: Diagnosis not present

## 2016-10-20 ENCOUNTER — Other Ambulatory Visit: Payer: Self-pay | Admitting: Medical

## 2016-10-23 NOTE — Telephone Encounter (Signed)
Can he have a refill on this last last tst level was in 2017 ?

## 2016-10-24 NOTE — Telephone Encounter (Signed)
Refill x 1 and get in for f/u

## 2016-11-22 DIAGNOSIS — F3181 Bipolar II disorder: Secondary | ICD-10-CM | POA: Diagnosis not present

## 2016-12-03 ENCOUNTER — Other Ambulatory Visit: Payer: Self-pay | Admitting: Medical

## 2016-12-26 ENCOUNTER — Ambulatory Visit (INDEPENDENT_AMBULATORY_CARE_PROVIDER_SITE_OTHER): Payer: BLUE CROSS/BLUE SHIELD | Admitting: Medical

## 2016-12-26 ENCOUNTER — Encounter: Payer: Self-pay | Admitting: Medical

## 2016-12-26 VITALS — BP 128/82 | HR 73 | Wt 167.0 lb

## 2016-12-26 DIAGNOSIS — F319 Bipolar disorder, unspecified: Secondary | ICD-10-CM

## 2016-12-26 DIAGNOSIS — E559 Vitamin D deficiency, unspecified: Secondary | ICD-10-CM | POA: Diagnosis not present

## 2016-12-26 DIAGNOSIS — R7989 Other specified abnormal findings of blood chemistry: Secondary | ICD-10-CM

## 2016-12-26 DIAGNOSIS — F411 Generalized anxiety disorder: Secondary | ICD-10-CM

## 2016-12-26 DIAGNOSIS — F3130 Bipolar disorder, current episode depressed, mild or moderate severity, unspecified: Secondary | ICD-10-CM | POA: Diagnosis not present

## 2016-12-26 DIAGNOSIS — G4700 Insomnia, unspecified: Secondary | ICD-10-CM

## 2016-12-26 DIAGNOSIS — E291 Testicular hypofunction: Secondary | ICD-10-CM | POA: Diagnosis not present

## 2016-12-26 MED ORDER — TESTOSTERONE 10 MG/ACT (2%) TD GEL: TRANSDERMAL | 2 refills | Status: DC

## 2016-12-26 NOTE — Patient Instructions (Signed)
Continue testosterone gel, but plan to return in a month for labs  Contact us by phone or my chart or recheck in a month to let me know how you are doing.    Some thoughts...  Do something positive today for somebody else Be generous, give to others I encourage you to take time this week to praise or encourage somebody Consider taking some time off for a vacation Journal or diary to help reflect on what isn't working, what you want to see in your life    Other resources: Dr. Letta Moynahan and Dr. Sabra Heck are specialist psychiatrist that may have other treatment recommendations  Dr. Caprice Beaver Address: [address], [place], [place] [address] Phone: [phone]  Dr. Sabra Heck Address: [address] [place], [place] [address] Phone: [phone]

## 2016-12-26 NOTE — Progress Notes (Signed)
Subjective: Chief Complaint  Patient presents with  . med check    med check , labs    Here for med check.  He notes lots of anxiety on a regular basis in general.  No specific trigger.   He feels no improvement in mood, anxiety compared to last visit.   He is seeing psychiatry, currently on Olanzapine which is new for him. Sees Dr. Granville Lewis for psychiatry and counseling every few weeks.  Has been dealing with worse anxiety issues this whole year.  Was on Latuda earlier in the year, but didn't tolerate that medication.  Has been through several medications this year without good response.  Was using Testosterone gel up until a week ago when he ran out.   doesn't feel like it was helping that much.   Has used Testopel in the past, has used monthly injections.   Hasn't seen any major improvement on either treatment.  Had seen Urology in the past.  Has also been on patches in the past.    Taking vitamin D weekly still.    Supplements - takes daily multivitamin, omega 3.    Goals - Government social research officer, creating project plans.    Past Medical History:  Diagnosis Date  . Allergy   . Anxiety   . Bipolar disorder ([place])    "quick cycler", hx/o manic episodes if off medication  . Colon polyps 06/26/2011   Ascending colon  . Depression   . Diverticulitis of colon 06/26/2011   per colonoscopy  . Grade I internal hemorrhoids 06/26/2011   colonscopy.   . Insomnia   . Migraine   . Sexual dysfunction    Current Outpatient Prescriptions on File Prior to Visit  Medication Sig Dispense Refill  . clonazePAM (KLONOPIN) 1 MG tablet Take 1 tablet (1 mg total) by mouth 2 (two) times daily. 60 tablet 0  . gabapentin (NEURONTIN) 300 MG capsule Take 1 capsule (300 mg total) by mouth at bedtime. As needed (Patient taking differently: Take 600 mg by mouth at bedtime. As needed) 90 capsule 0  . Vitamin D, Ergocalciferol, (DRISDOL) 50000 units CAPS capsule TAKE 1 CAPSULE (50,000 UNITS TOTAL) BY MOUTH EVERY 7  (SEVEN) DAYS. 12 capsule 1   No current facility-administered medications on file prior to visit.    ROS as in subjective    Objective: BP 128/82   Pulse 73   Wt 167 lb (75.8 kg)   SpO2 96%   BMI 25.96 kg/m   Gen: wd, wn, nad Psych: blunted affect, good eye contact, answers questions appropriately    Assessment: Encounter Diagnoses  Name Primary?  . Hypogonadism in male Yes  . Low testosterone   . Vitamin D deficiency   . Bipolar disorder with depression ([place])   . Generalized anxiety disorder   . Insomnia, unspecified type     Plan: Spent most of the time today encouraging him, counseling on ways to work on goals, to work on his emotional health, discussed journaling, goal setting, surrounding himself with positive people.  Reducing stress where possible.    Get back in with counselor soon.     C/t same medication.  Since he has been off TST a week, plan to restart TST and recheck labs in 1 month  Advised close f/u.    Jesse Hardin was seen today for med check.  Diagnoses and all orders for this visit:  Hypogonadism in male  Low testosterone  Vitamin D deficiency  Bipolar disorder with depression ([place])  Generalized anxiety disorder  Insomnia, unspecified type

## 2017-01-03 DIAGNOSIS — F3181 Bipolar II disorder: Secondary | ICD-10-CM | POA: Diagnosis not present

## 2017-01-13 DIAGNOSIS — Z5181 Encounter for therapeutic drug level monitoring: Secondary | ICD-10-CM | POA: Diagnosis not present

## 2017-01-13 DIAGNOSIS — D519 Vitamin B12 deficiency anemia, unspecified: Secondary | ICD-10-CM | POA: Diagnosis not present

## 2017-01-13 DIAGNOSIS — E559 Vitamin D deficiency, unspecified: Secondary | ICD-10-CM | POA: Diagnosis not present

## 2017-01-26 ENCOUNTER — Telehealth: Payer: Self-pay | Admitting: Medical

## 2017-01-26 ENCOUNTER — Other Ambulatory Visit: Payer: Self-pay | Admitting: Medical

## 2017-01-26 ENCOUNTER — Encounter: Payer: Self-pay | Admitting: Medical

## 2017-01-26 ENCOUNTER — Ambulatory Visit
Admission: RE | Admit: 2017-01-26 | Discharge: 2017-01-26 | Disposition: A | Discharge status: Home / Self Care | Payer: BLUE CROSS/BLUE SHIELD | Source: Ambulatory Visit | Attending: Medical | Admitting: Medical

## 2017-01-26 ENCOUNTER — Ambulatory Visit (INDEPENDENT_AMBULATORY_CARE_PROVIDER_SITE_OTHER): Payer: BLUE CROSS/BLUE SHIELD | Admitting: Medical

## 2017-01-26 VITALS — BP 118/78 | HR 80 | Wt 165.0 lb

## 2017-01-26 DIAGNOSIS — M25511 Pain in right shoulder: Principal | ICD-10-CM

## 2017-01-26 DIAGNOSIS — Z125 Encounter for screening for malignant neoplasm of prostate: Secondary | ICD-10-CM

## 2017-01-26 DIAGNOSIS — F3130 Bipolar disorder, current episode depressed, mild or moderate severity, unspecified: Secondary | ICD-10-CM | POA: Diagnosis not present

## 2017-01-26 DIAGNOSIS — F319 Bipolar disorder, unspecified: Secondary | ICD-10-CM

## 2017-01-26 DIAGNOSIS — R7989 Other specified abnormal findings of blood chemistry: Secondary | ICD-10-CM | POA: Diagnosis not present

## 2017-01-26 DIAGNOSIS — E291 Testicular hypofunction: Secondary | ICD-10-CM

## 2017-01-26 DIAGNOSIS — G8929 Other chronic pain: Secondary | ICD-10-CM

## 2017-01-26 DIAGNOSIS — E559 Vitamin D deficiency, unspecified: Secondary | ICD-10-CM

## 2017-01-26 MED ORDER — TRAMADOL HCL 50 MG PO TABS: 50.0000 mg | ORAL_TABLET | Freq: Four times a day (QID) | ORAL | 0 refills | Status: AC | PRN
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
## 2017-01-26 NOTE — Telephone Encounter (Signed)
Called notified pt

## 2017-01-26 NOTE — Patient Instructions (Signed)
Dr. Sheralyn Boatman Phone: [phone]     Dr. Carlton Adam Address: [address], [place], [place] [address] Phone: [phone]

## 2017-01-26 NOTE — Telephone Encounter (Signed)
Please call him back. I received ortho notes and they did not image him. Thus, have him go to [facility] for xray.

## 2017-01-27 LAB — PSA: PSA: 0.5 ng/mL (ref <=4.0)

## 2017-01-27 LAB — CBC WITH DIFFERENTIAL/PLATELET
BASOS ABS: 19 {cells}/uL (ref 0–200)
BASOS PCT: 0.3 %
EOS ABS: 149 {cells}/uL (ref 15–500)
Eosinophils Relative: 2.4 %
HCT: 42.7 % (ref 38.5–50.0)
HEMOGLOBIN: 14.6 g/dL (ref 13.2–17.1)
Lymphs Abs: 1990 cells/uL (ref 850–3900)
MCH: 29.6 pg (ref 27.0–33.0)
MCHC: 34.2 g/dL (ref 32.0–36.0)
MCV: 86.4 fL (ref 80.0–100.0)
MPV: 9 fL (ref 7.5–12.5)
Monocytes Relative: 8.6 %
NEUTROS ABS: 3509 {cells}/uL (ref 1500–7800)
Neutrophils Relative %: 56.6 %
Platelets: 274 10*3/uL (ref 140–400)
RBC: 4.94 10*6/uL (ref 4.20–5.80)
RDW: 13.3 % (ref 11.0–15.0)
TOTAL LYMPHOCYTE: 32.1 %
WBC mixed population: 533 cells/uL (ref 200–950)
WBC: 6.2 10*3/uL (ref 3.8–10.8)

## 2017-01-27 LAB — VITAMIN D 25 HYDROXY (VIT D DEFICIENCY, FRACTURES): Vit D, 25-Hydroxy: 53 ng/mL (ref 30–100)

## 2017-01-27 LAB — TESTOSTERONE: TESTOSTERONE: 581 ng/dL (ref 250–827)

## 2017-01-29 NOTE — Addendum Note (Signed)
Addended by: Tyrone Apple on: 01/29/2017 11:04 AM   Modules accepted: Orders

## 2017-02-01 DIAGNOSIS — F3181 Bipolar II disorder: Secondary | ICD-10-CM | POA: Diagnosis not present

## 2017-02-08 ENCOUNTER — Ambulatory Visit (INDEPENDENT_AMBULATORY_CARE_PROVIDER_SITE_OTHER): Payer: BLUE CROSS/BLUE SHIELD | Admitting: Orthopaedic Surgery

## 2017-02-08 ENCOUNTER — Encounter (INDEPENDENT_AMBULATORY_CARE_PROVIDER_SITE_OTHER): Payer: Self-pay | Admitting: Orthopaedic Surgery

## 2017-02-08 DIAGNOSIS — M7541 Impingement syndrome of right shoulder: Secondary | ICD-10-CM

## 2017-02-08 MED ORDER — BUPIVACAINE HCL 0.5 % IJ SOLN
3.0000 mL | INTRAMUSCULAR | Status: AC | PRN
  Administered 2017-02-08: 3 mL via INTRA_ARTICULAR

## 2017-02-08 MED ORDER — LIDOCAINE HCL 1 % IJ SOLN
3.0000 mL | INTRAMUSCULAR | Status: AC | PRN
  Administered 2017-02-08: 3 mL

## 2017-02-08 MED ORDER — METHYLPREDNISOLONE ACETATE 40 MG/ML IJ SUSP
40.0000 mg | INTRAMUSCULAR | Status: AC | PRN
  Administered 2017-02-08: 40 mg via INTRA_ARTICULAR
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
## 2017-02-15 DIAGNOSIS — F3181 Bipolar II disorder: Secondary | ICD-10-CM | POA: Diagnosis not present

## 2017-03-07 ENCOUNTER — Other Ambulatory Visit: Payer: Self-pay | Admitting: Medical

## 2017-03-15 DIAGNOSIS — F3181 Bipolar II disorder: Secondary | ICD-10-CM | POA: Diagnosis not present

## 2017-04-09 ENCOUNTER — Encounter: Payer: Self-pay | Admitting: Medical

## 2017-04-19 DIAGNOSIS — F3181 Bipolar II disorder: Secondary | ICD-10-CM | POA: Diagnosis not present

## 2017-05-07 DIAGNOSIS — R0602 Shortness of breath: Secondary | ICD-10-CM | POA: Diagnosis not present

## 2017-05-07 DIAGNOSIS — J018 Other acute sinusitis: Secondary | ICD-10-CM | POA: Diagnosis not present

## 2017-05-17 DIAGNOSIS — F319 Bipolar disorder, unspecified: Secondary | ICD-10-CM | POA: Diagnosis not present

## 2017-06-20 ENCOUNTER — Encounter: Payer: Self-pay | Admitting: Medical

## 2017-06-20 ENCOUNTER — Ambulatory Visit (INDEPENDENT_AMBULATORY_CARE_PROVIDER_SITE_OTHER): Payer: BLUE CROSS/BLUE SHIELD | Admitting: Medical

## 2017-06-20 VITALS — BP 112/82 | HR 82 | Temp 98.1°F | Ht 67.75 in | Wt 165.8 lb

## 2017-06-20 DIAGNOSIS — G4700 Insomnia, unspecified: Secondary | ICD-10-CM | POA: Diagnosis not present

## 2017-06-20 DIAGNOSIS — F411 Generalized anxiety disorder: Secondary | ICD-10-CM | POA: Diagnosis not present

## 2017-06-20 DIAGNOSIS — R7989 Other specified abnormal findings of blood chemistry: Secondary | ICD-10-CM | POA: Diagnosis not present

## 2017-06-20 DIAGNOSIS — E559 Vitamin D deficiency, unspecified: Secondary | ICD-10-CM

## 2017-06-20 DIAGNOSIS — F3130 Bipolar disorder, current episode depressed, mild or moderate severity, unspecified: Secondary | ICD-10-CM | POA: Diagnosis not present

## 2017-06-20 DIAGNOSIS — Z1211 Encounter for screening for malignant neoplasm of colon: Secondary | ICD-10-CM | POA: Insufficient documentation

## 2017-06-20 DIAGNOSIS — Z113 Encounter for screening for infections with a predominantly sexual mode of transmission: Secondary | ICD-10-CM

## 2017-06-20 DIAGNOSIS — Z7189 Other specified counseling: Secondary | ICD-10-CM | POA: Diagnosis not present

## 2017-06-20 DIAGNOSIS — Z8249 Family history of ischemic heart disease and other diseases of the circulatory system: Secondary | ICD-10-CM | POA: Diagnosis not present

## 2017-06-20 DIAGNOSIS — M859 Disorder of bone density and structure, unspecified: Secondary | ICD-10-CM

## 2017-06-20 DIAGNOSIS — M8580 Other specified disorders of bone density and structure, unspecified site: Secondary | ICD-10-CM | POA: Diagnosis not present

## 2017-06-20 DIAGNOSIS — E291 Testicular hypofunction: Secondary | ICD-10-CM | POA: Diagnosis not present

## 2017-06-20 DIAGNOSIS — Z7185 Encounter for immunization safety counseling: Secondary | ICD-10-CM

## 2017-06-20 DIAGNOSIS — F319 Bipolar disorder, unspecified: Secondary | ICD-10-CM

## 2017-06-20 DIAGNOSIS — Z0000 Encounter for general adult medical examination without abnormal findings: Secondary | ICD-10-CM | POA: Diagnosis not present
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
## 2017-06-20 NOTE — Patient Instructions (Signed)
Thanks for trusting [place] with your health care and for coming in for a physical today.  Below are some general recommendations I have for you:  Yearly screenings See your eye doctor yearly for routine vision care. See your dentist yearly for routine dental care including hygiene visits twice yearly. See me here yearly for a routine physical and preventative care visit  Immunizations "vaccines"  I recommend you have a yearly influenza vaccine typically in the fall such as September yearly to prevent the flu  Shingles vaccine:  I recommend you have a shingles vaccine to help prevent shingles or herpes zoster outbreak.   Please call your insurer to inquire about coverage for the Shingrix vaccine given in 2 doses.   Some insurers cover this vaccine after age 80, some cover this after age 65.  If your insurer covers this, then call to schedule appointment to have this vaccine here.   Specific Concerns today: Schedule your bone density test  We will call with lab results

## 2017-06-21 ENCOUNTER — Telehealth: Payer: Self-pay

## 2017-06-21 LAB — CBC WITH DIFFERENTIAL/PLATELET
BASOS: 0 %
Basophils Absolute: 0 10*3/uL (ref 0.0–0.2)
EOS (ABSOLUTE): 0.1 10*3/uL (ref 0.0–0.4)
EOS: 1 %
HEMATOCRIT: 46.4 % (ref 37.5–51.0)
Hemoglobin: 15.6 g/dL (ref 13.0–17.7)
IMMATURE GRANS (ABS): 0 10*3/uL (ref 0.0–0.1)
Immature Granulocytes: 0 %
Lymphocytes Absolute: 2.2 10*3/uL (ref 0.7–3.1)
Lymphs: 30 %
MCH: 30.4 pg (ref 26.6–33.0)
MCHC: 33.6 g/dL (ref 31.5–35.7)
MCV: 90 fL (ref 79–97)
MONOS ABS: 0.5 10*3/uL (ref 0.1–0.9)
Monocytes: 6 %
NEUTROS ABS: 4.6 10*3/uL (ref 1.4–7.0)
Neutrophils: 63 %
Platelets: 290 10*3/uL (ref 150–379)
RBC: 5.13 x10E6/uL (ref 4.14–5.80)
RDW: 14.2 % (ref 12.3–15.4)
WBC: 7.3 10*3/uL (ref 3.4–10.8)

## 2017-06-21 LAB — COMPREHENSIVE METABOLIC PANEL
A/G RATIO: 2 (ref 1.2–2.2)
ALBUMIN: 4.7 g/dL (ref 3.5–5.5)
ALT: 24 IU/L (ref 0–44)
AST: 26 IU/L (ref 0–40)
Alkaline Phosphatase: 60 IU/L (ref 39–117)
BUN/Creatinine Ratio: 14 (ref 9–20)
BUN: 18 mg/dL (ref 6–24)
Bilirubin Total: 0.5 mg/dL (ref 0.0–1.2)
CO2: 26 mmol/L (ref 20–29)
CREATININE: 1.25 mg/dL (ref 0.76–1.27)
Calcium: 10.1 mg/dL (ref 8.7–10.2)
Chloride: 105 mmol/L (ref 96–106)
GFR calc non Af Amer: 64 mL/min/{1.73_m2} (ref >59)
GFR, EST AFRICAN AMERICAN: 74 mL/min/{1.73_m2} (ref >59)
GLOBULIN, TOTAL: 2.4 g/dL (ref 1.5–4.5)
Glucose: 107 mg/dL — ABNORMAL HIGH (ref 65–99)
POTASSIUM: 4.5 mmol/L (ref 3.5–5.2)
SODIUM: 145 mmol/L — AB (ref 134–144)
TOTAL PROTEIN: 7.1 g/dL (ref 6.0–8.5)

## 2017-06-21 LAB — HEPATITIS B SURFACE ANTIGEN: Hepatitis B Surface Ag: NEGATIVE

## 2017-06-21 LAB — TESTOSTERONE: Testosterone: 247 ng/dL — ABNORMAL LOW (ref 264–916)

## 2017-06-21 LAB — HEPATITIS B CORE ANTIBODY, IGM: HEP B C IGM: NEGATIVE

## 2017-06-21 LAB — VITAMIN D 25 HYDROXY (VIT D DEFICIENCY, FRACTURES): Vit D, 25-Hydroxy: 54.9 ng/mL (ref 30.0–100.0)

## 2017-06-21 LAB — HEMOGLOBIN A1C
Est. average glucose Bld gHb Est-mCnc: 114 mg/dL
Hgb A1c MFr Bld: 5.6 % (ref 4.8–5.6)

## 2017-06-21 LAB — LIPID PANEL
CHOL/HDL RATIO: 4.2 ratio (ref 0.0–5.0)
Cholesterol, Total: 252 mg/dL — ABNORMAL HIGH (ref 100–199)
HDL: 60 mg/dL (ref >39)
LDL Calculated: 169 mg/dL — ABNORMAL HIGH (ref 0–99)
Triglycerides: 115 mg/dL (ref 0–149)
VLDL CHOLESTEROL CAL: 23 mg/dL (ref 5–40)

## 2017-06-21 LAB — GC/CHLAMYDIA PROBE AMP
Chlamydia trachomatis, NAA: NEGATIVE
NEISSERIA GONORRHOEAE BY PCR: NEGATIVE

## 2017-06-21 LAB — RPR: RPR Ser Ql: NONREACTIVE

## 2017-06-21 LAB — HEPATITIS B SURFACE ANTIBODY, QUANTITATIVE: Hepatitis B Surf Ab Quant: 7.8 m[IU]/mL — ABNORMAL LOW (ref >9.9)

## 2017-06-21 LAB — TSH: TSH: 1.54 u[IU]/mL (ref 0.450–4.500)

## 2017-06-21 LAB — HIV ANTIBODY (ROUTINE TESTING W REFLEX): HIV Screen 4th Generation wRfx: NONREACTIVE

## 2017-06-21 LAB — HEPATITIS C ANTIBODY: Hep C Virus Ab: <0.1 s/co ratio (ref 0.0–0.9)

## 2017-06-21 NOTE — Telephone Encounter (Signed)
-----   Message from Carlena Hurl, PA-C sent at 06/21/2017  8:08 AM EDT ----- His labs show slightly elevated blood sugar, cholesterol too high, testosterone was low, and he is not immune to hepatitis.  Still pending gonorrhea chlamydia test.  Thankfully negative for HIV, syphilis, hepatitis B and C.  Given his cholesterol, I would recommend medication to lower the cholesterol which also lowers his heart disease risk.  If he is agreeable to this I will send in the medicine to begin daily at bedtime and recheck fasting in 3 months.  Have him schedule a bone density test as we discussed and check insurance coverage for shingles vaccine  If he would like to begin testosterone to improve on energy levels he could either start the injection every 2 weeks here or topical gel at home.  See if he is interested in this?    Finally I would recommend hepatitis B vaccine series since he is not immune to hepatitis B.  This is a 3 shot series given over 6 months.  He can start this at his convenience.

## 2017-06-21 NOTE — Telephone Encounter (Signed)
Called, LVM advising patient to call back to discuss lab work.

## 2017-06-22 ENCOUNTER — Telehealth: Payer: Self-pay

## 2017-06-22 NOTE — Telephone Encounter (Signed)
Called, LVM advising patient to call back regarding lab work. Left call back number.

## 2017-06-22 NOTE — Telephone Encounter (Signed)
-----   Message from Carlena Hurl, PA-C sent at 06/22/2017  8:15 AM EDT ----- See other lab result below, but his gonorrhea chlamydia test was negative

## 2017-06-26 ENCOUNTER — Other Ambulatory Visit: Payer: Self-pay | Admitting: Medical

## 2017-06-26 ENCOUNTER — Telehealth: Payer: Self-pay

## 2017-06-26 MED ORDER — TESTOSTERONE 20.25 MG/1.25GM (1.62%) TD GEL: 2.0000 | Freq: Every day | TRANSDERMAL | 2 refills | Status: DC

## 2017-06-26 MED ORDER — ROSUVASTATIN CALCIUM 10 MG PO TABS: 10.0000 mg | ORAL_TABLET | Freq: Every day | ORAL | 1 refills | Status: DC

## 2017-06-26 NOTE — Telephone Encounter (Signed)
Called patient, advised of lab results.  Patient would like to start the cholesterol medication, and made fasting lab appointment in 3 months.  Patient also stated he would like to do the topical gel at home.   Patient declined the hep B vaccine at this time. Will check with insurance regarding the other.

## 2017-06-26 NOTE — Telephone Encounter (Signed)
-----   Message from Carlena Hurl, PA-C sent at 06/21/2017  8:08 AM EDT ----- His labs show slightly elevated blood sugar, cholesterol too high, testosterone was low, and he is not immune to hepatitis.  Still pending gonorrhea chlamydia test.  Thankfully negative for HIV, syphilis, hepatitis B and C.  Given his cholesterol, I would recommend medication to lower the cholesterol which also lowers his heart disease risk.  If he is agreeable to this I will send in the medicine to begin daily at bedtime and recheck fasting in 3 months.  Have him schedule a bone density test as we discussed and check insurance coverage for shingles vaccine  If he would like to begin testosterone to improve on energy levels he could either start the injection every 2 weeks here or topical gel at home.  See if he is interested in this?    Finally I would recommend hepatitis B vaccine series since he is not immune to hepatitis B.  This is a 3 shot series given over 6 months.  He can start this at his convenience.

## 2017-07-12 DIAGNOSIS — F319 Bipolar disorder, unspecified: Secondary | ICD-10-CM | POA: Diagnosis not present

## 2017-08-08 DIAGNOSIS — F319 Bipolar disorder, unspecified: Secondary | ICD-10-CM | POA: Diagnosis not present

## 2017-08-15 ENCOUNTER — Telehealth: Payer: Self-pay | Admitting: Medical

## 2017-08-15 ENCOUNTER — Other Ambulatory Visit: Payer: Self-pay | Admitting: Medical

## 2017-08-15 MED ORDER — VITAMIN D 1000 UNITS PO TABS: 1000.0000 [IU] | ORAL_TABLET | Freq: Every day | ORAL | 3 refills | Status: DC

## 2017-08-15 NOTE — Telephone Encounter (Signed)
Pharmacy sent refill request.   I changed the Vit D to 1000 u daily instead of the weekly

## 2017-08-15 NOTE — Telephone Encounter (Signed)
Pharmacy is requesting to fill pt vit. Last test was 06-20-17 and his result was 54.9. Please advise if he still needs script. [place]

## 2017-09-04 DIAGNOSIS — F319 Bipolar disorder, unspecified: Secondary | ICD-10-CM | POA: Diagnosis not present

## 2017-09-24 ENCOUNTER — Other Ambulatory Visit: Payer: BLUE CROSS/BLUE SHIELD

## 2017-09-24 ENCOUNTER — Other Ambulatory Visit: Payer: Self-pay | Admitting: Medical

## 2017-09-24 DIAGNOSIS — E785 Hyperlipidemia, unspecified: Secondary | ICD-10-CM

## 2017-09-25 ENCOUNTER — Other Ambulatory Visit: Payer: Self-pay | Admitting: Medical

## 2017-09-25 LAB — LIPID PANEL
CHOL/HDL RATIO: 2.6 ratio (ref 0.0–5.0)
Cholesterol, Total: 146 mg/dL (ref 100–199)
HDL: 57 mg/dL (ref >39)
LDL Calculated: 71 mg/dL (ref 0–99)
Triglycerides: 88 mg/dL (ref 0–149)
VLDL Cholesterol Cal: 18 mg/dL (ref 5–40)

## 2017-09-25 LAB — HEPATIC FUNCTION PANEL
ALBUMIN: 4.4 g/dL (ref 3.5–5.5)
ALT: 23 IU/L (ref 0–44)
AST: 24 IU/L (ref 0–40)
Alkaline Phosphatase: 56 IU/L (ref 39–117)
BILIRUBIN TOTAL: 0.4 mg/dL (ref 0.0–1.2)
Bilirubin, Direct: 0.14 mg/dL (ref 0.00–0.40)
Total Protein: 6.4 g/dL (ref 6.0–8.5)

## 2017-09-25 MED ORDER — ROSUVASTATIN CALCIUM 10 MG PO TABS
10.0000 mg | ORAL_TABLET | Freq: Every day | ORAL | 2 refills | Status: DC
Start: 2017-09-25 — End: 2018-09-04

## 2017-10-03 DIAGNOSIS — F319 Bipolar disorder, unspecified: Secondary | ICD-10-CM | POA: Diagnosis not present

## 2017-10-29 ENCOUNTER — Other Ambulatory Visit: Payer: Self-pay | Admitting: Medical

## 2017-11-06 DIAGNOSIS — F319 Bipolar disorder, unspecified: Secondary | ICD-10-CM | POA: Diagnosis not present

## 2017-12-18 DIAGNOSIS — F319 Bipolar disorder, unspecified: Secondary | ICD-10-CM | POA: Diagnosis not present

## 2018-01-17 DIAGNOSIS — F319 Bipolar disorder, unspecified: Secondary | ICD-10-CM | POA: Diagnosis not present

## 2018-02-27 DIAGNOSIS — F319 Bipolar disorder, unspecified: Secondary | ICD-10-CM | POA: Diagnosis not present

## 2018-04-11 DIAGNOSIS — F319 Bipolar disorder, unspecified: Secondary | ICD-10-CM | POA: Diagnosis not present

## 2018-05-21 DIAGNOSIS — F319 Bipolar disorder, unspecified: Secondary | ICD-10-CM | POA: Diagnosis not present

## 2018-05-27 DIAGNOSIS — R05 Cough: Secondary | ICD-10-CM | POA: Diagnosis not present

## 2018-05-27 DIAGNOSIS — J208 Acute bronchitis due to other specified organisms: Secondary | ICD-10-CM | POA: Diagnosis not present

## 2018-05-27 DIAGNOSIS — R0981 Nasal congestion: Secondary | ICD-10-CM | POA: Diagnosis not present

## 2018-07-02 ENCOUNTER — Encounter: Payer: BLUE CROSS/BLUE SHIELD | Admitting: Medical

## 2018-07-02 DIAGNOSIS — F319 Bipolar disorder, unspecified: Secondary | ICD-10-CM | POA: Diagnosis not present

## 2018-07-26 ENCOUNTER — Encounter: Payer: Self-pay | Admitting: Family Medicine

## 2018-07-26 ENCOUNTER — Ambulatory Visit: Payer: BLUE CROSS/BLUE SHIELD | Admitting: Family Medicine

## 2018-07-26 ENCOUNTER — Other Ambulatory Visit: Payer: Self-pay

## 2018-07-26 VITALS — Temp 98.6°F | Wt 175.0 lb

## 2018-07-26 DIAGNOSIS — J4520 Mild intermittent asthma, uncomplicated: Secondary | ICD-10-CM | POA: Diagnosis not present

## 2018-07-26 DIAGNOSIS — R05 Cough: Secondary | ICD-10-CM

## 2018-07-26 DIAGNOSIS — R062 Wheezing: Secondary | ICD-10-CM | POA: Diagnosis not present

## 2018-07-26 DIAGNOSIS — R058 Other specified cough: Secondary | ICD-10-CM

## 2018-07-26 MED ORDER — ALBUTEROL SULFATE HFA 108 (90 BASE) MCG/ACT IN AERS: 2.0000 | INHALATION_SPRAY | Freq: Four times a day (QID) | RESPIRATORY_TRACT | 0 refills | Status: DC | PRN

## 2018-07-26 NOTE — Progress Notes (Signed)
   Subjective:   Interactive audio and video telecommunications were attempted between this provider and patient, however due to patient not having access to video capability, we continued and completed visit with audio only.  The patient was located at home. 2 patient identifiers used.  The provider was located in the office. The patient did consent to this visit and is aware of possible charges through their insurance for this visit.  The other persons participating in this telemedicine service were none.    Patient ID: Jesse Hardin, male    DOB: 1960/06/22, 58 y.o.   MRN: [id number]  HPI Chief Complaint  Patient presents with  . allergies    allergies and cough. walking outside, his breathing gets restricted.    Complains of a 2 week history of coughing, throat irritation, and chest feels tight when he goes outside.  He does note some wheezing.  This is ongoing since the "pollen got bad".  Reports symptoms always resolve within 2 hours of going back inside the house.  No itchy or watery eyes, rhinorrhea, nasal congestion, ear pain, post nasal drainage.  Denies fever, chills, dizziness, headache, chest pain, palpitations, DOE, orthopnea, abdominal pain, N/V/D, LE edema.  He tried Zyrtec daily and this has not helped at all.    History of allergies but states they have not been this bad in the past. In his 55s he moved from the [place] to [place] he was given an inhaler to use for similar symptoms.   Does not smoke.  Denies history of asthma.   No new pets.   Reviewed allergies, medications, past medical, surgical, family, and social history.   Review of Systems Pertinent positives and negatives in the history of present illness.     Objective:   Physical Exam Temp 98.6 F (37 C) (Oral)   Wt 175 lb (79.4 kg)   BMI 26.81 kg/m  Alert and oriented and in no acute distress.  Respirations unlabored.  Speaking in complete sentences without difficulty.  No cough while  talking.  Normal speech, mood and thought process.      Assessment & Plan:  Mild intermittent extrinsic asthma without complication - Plan: albuterol (VENTOLIN HFA) 108 (90 Base) MCG/ACT inhaler  Discussed limitations of a virtual visit.  He does not appear to have acute infectious process. Allergy and asthma symptoms only when outside in pollen for the past few weeks but he does report a history of using albuterol inhaler when he moved from [place] to [place] years ago.  I will have him try Xyzal daily and prescribe albuterol for him.  Advised to change clothes and wash off pollen after being outside and going inside especially before going to bed. Windows closed in house.  May need PFT at his upcoming CPE with his PCP Dorothea Ogle.   Time spent on call was 15 minutes and in review of previous records 2 minutes total.  This virtual service is not related to other E/M service within previous 7 days.

## 2018-07-30 DIAGNOSIS — F319 Bipolar disorder, unspecified: Secondary | ICD-10-CM | POA: Diagnosis not present

## 2018-08-27 DIAGNOSIS — F319 Bipolar disorder, unspecified: Secondary | ICD-10-CM | POA: Diagnosis not present

## 2018-09-03 ENCOUNTER — Other Ambulatory Visit: Payer: Self-pay

## 2018-09-03 ENCOUNTER — Ambulatory Visit (INDEPENDENT_AMBULATORY_CARE_PROVIDER_SITE_OTHER): Payer: BC Managed Care – PPO | Admitting: Medical

## 2018-09-03 ENCOUNTER — Encounter: Payer: Self-pay | Admitting: Medical

## 2018-09-03 VITALS — BP 122/80 | HR 94 | Temp 98.0°F | Resp 16 | Ht 69.0 in | Wt 178.2 lb

## 2018-09-03 DIAGNOSIS — Z7185 Encounter for immunization safety counseling: Secondary | ICD-10-CM

## 2018-09-03 DIAGNOSIS — M25511 Pain in right shoulder: Secondary | ICD-10-CM | POA: Diagnosis not present

## 2018-09-03 DIAGNOSIS — Z125 Encounter for screening for malignant neoplasm of prostate: Secondary | ICD-10-CM

## 2018-09-03 DIAGNOSIS — R0600 Dyspnea, unspecified: Secondary | ICD-10-CM | POA: Insufficient documentation

## 2018-09-03 DIAGNOSIS — Z7189 Other specified counseling: Secondary | ICD-10-CM

## 2018-09-03 DIAGNOSIS — M8580 Other specified disorders of bone density and structure, unspecified site: Secondary | ICD-10-CM

## 2018-09-03 DIAGNOSIS — Z1211 Encounter for screening for malignant neoplasm of colon: Secondary | ICD-10-CM

## 2018-09-03 DIAGNOSIS — Z8249 Family history of ischemic heart disease and other diseases of the circulatory system: Secondary | ICD-10-CM

## 2018-09-03 DIAGNOSIS — Z0000 Encounter for general adult medical examination without abnormal findings: Secondary | ICD-10-CM | POA: Diagnosis not present

## 2018-09-03 DIAGNOSIS — E559 Vitamin D deficiency, unspecified: Secondary | ICD-10-CM

## 2018-09-03 DIAGNOSIS — R221 Localized swelling, mass and lump, neck: Secondary | ICD-10-CM | POA: Diagnosis not present

## 2018-09-03 DIAGNOSIS — G8929 Other chronic pain: Secondary | ICD-10-CM

## 2018-09-03 NOTE — Addendum Note (Signed)
Addended by: Carlena Hurl on: 09/03/2018 01:53 PM   Modules accepted: Orders

## 2018-09-03 NOTE — Addendum Note (Signed)
Addended by: Carlena Hurl on: 09/03/2018 01:54 PM   Modules accepted: Orders
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
## 2018-09-04 ENCOUNTER — Other Ambulatory Visit: Payer: Self-pay | Admitting: Medical

## 2018-09-04 MED ORDER — VITAMIN D 25 MCG (1000 UNIT) PO TABS: 2000.0000 [IU] | ORAL_TABLET | Freq: Every day | ORAL | 3 refills | Status: DC

## 2018-09-04 MED ORDER — ROSUVASTATIN CALCIUM 10 MG PO TABS: 10.0000 mg | ORAL_TABLET | Freq: Every day | ORAL | 3 refills | Status: DC

## 2018-09-05 LAB — COMPREHENSIVE METABOLIC PANEL
ALT: 16 IU/L (ref 0–44)
AST: 21 IU/L (ref 0–40)
Albumin/Globulin Ratio: 2.3 — ABNORMAL HIGH (ref 1.2–2.2)
Albumin: 4.8 g/dL (ref 3.8–4.9)
Alkaline Phosphatase: 80 IU/L (ref 39–117)
BUN/Creatinine Ratio: 13 (ref 9–20)
BUN: 15 mg/dL (ref 6–24)
Bilirubin Total: 0.3 mg/dL (ref 0.0–1.2)
CO2: 21 mmol/L (ref 20–29)
Calcium: 9.8 mg/dL (ref 8.7–10.2)
Chloride: 103 mmol/L (ref 96–106)
Creatinine, Ser: 1.13 mg/dL (ref 0.76–1.27)
GFR calc Af Amer: 83 mL/min/{1.73_m2} (ref >59)
GFR calc non Af Amer: 72 mL/min/{1.73_m2} (ref >59)
Globulin, Total: 2.1 g/dL (ref 1.5–4.5)
Glucose: 89 mg/dL (ref 65–99)
Potassium: 4.4 mmol/L (ref 3.5–5.2)
Sodium: 145 mmol/L — ABNORMAL HIGH (ref 134–144)
Total Protein: 6.9 g/dL (ref 6.0–8.5)

## 2018-09-05 LAB — ALLERGEN PROFILE, MINI-PANEL
Alternaria Alternata IgE: <0.1 kU/L
Bermuda Grass IgE: <0.1 kU/L
Cat Dander IgE: <0.1 kU/L
D Farinae IgE: <0.1 kU/L
D Pteronyssinus IgE: <0.1 kU/L
Dog Dander IgE: <0.1 kU/L
Elm, American IgE: <0.1 kU/L
Kentucky Bluegrass IgE: <0.1 kU/L
Mouse Urine IgE: <0.1 kU/L
Oak, White IgE: <0.1 kU/L
Plantain, English IgE: <0.1 kU/L
Ragweed, Short IgE: <0.1 kU/L

## 2018-09-05 LAB — FOOD ALLERGY PROFILE
Allergen Corn, IgE: <0.1 kU/L
Clam IgE: <0.1 kU/L
Codfish IgE: <0.1 kU/L
Egg White IgE: <0.1 kU/L
Milk IgE: <0.1 kU/L
Peanut IgE: <0.1 kU/L
Scallop IgE: <0.1 kU/L
Sesame Seed IgE: <0.1 kU/L
Shrimp IgE: <0.1 kU/L
Soybean IgE: <0.1 kU/L
Walnut IgE: <0.1 kU/L
Wheat IgE: 0.16 kU/L — AB

## 2018-09-05 LAB — LIPID PANEL
Chol/HDL Ratio: 2.9 ratio (ref 0.0–5.0)
Cholesterol, Total: 168 mg/dL (ref 100–199)
HDL: 58 mg/dL (ref >39)
LDL Calculated: 84 mg/dL (ref 0–99)
Triglycerides: 131 mg/dL (ref 0–149)
VLDL Cholesterol Cal: 26 mg/dL (ref 5–40)

## 2018-09-05 LAB — CBC WITH DIFFERENTIAL/PLATELET
Basophils Absolute: 0 10*3/uL (ref 0.0–0.2)
Basos: 0 %
EOS (ABSOLUTE): 0.1 10*3/uL (ref 0.0–0.4)
Eos: 1 %
Hematocrit: 47 % (ref 37.5–51.0)
Hemoglobin: 15.7 g/dL (ref 13.0–17.7)
Immature Grans (Abs): 0 10*3/uL (ref 0.0–0.1)
Immature Granulocytes: 0 %
Lymphocytes Absolute: 2.4 10*3/uL (ref 0.7–3.1)
Lymphs: 32 %
MCH: 29.7 pg (ref 26.6–33.0)
MCHC: 33.4 g/dL (ref 31.5–35.7)
MCV: 89 fL (ref 79–97)
Monocytes Absolute: 0.7 10*3/uL (ref 0.1–0.9)
Monocytes: 10 %
Neutrophils Absolute: 4.5 10*3/uL (ref 1.4–7.0)
Neutrophils: 57 %
Platelets: 302 10*3/uL (ref 150–450)
RBC: 5.29 x10E6/uL (ref 4.14–5.80)
RDW: 13.2 % (ref 11.6–15.4)
WBC: 7.7 10*3/uL (ref 3.4–10.8)

## 2018-09-05 LAB — ALLERGEN, PORK, F26: Pork IgE: <0.1 kU/L

## 2018-09-05 LAB — ALLERGEN, CHICKEN F83: Chicken IgE: <0.1 kU/L

## 2018-09-05 LAB — VITAMIN D 25 HYDROXY (VIT D DEFICIENCY, FRACTURES): Vit D, 25-Hydroxy: 27.8 ng/mL — ABNORMAL LOW (ref 30.0–100.0)

## 2018-09-05 LAB — VITAMIN B12: Vitamin B-12: 453 pg/mL (ref 232–1245)

## 2018-09-05 LAB — ALLERGEN BEEF: Beef IgE: <0.1 kU/L

## 2018-09-06 ENCOUNTER — Other Ambulatory Visit: Payer: Self-pay | Admitting: Medical

## 2018-09-17 ENCOUNTER — Other Ambulatory Visit: Payer: Self-pay

## 2018-09-17 ENCOUNTER — Ambulatory Visit: Payer: Self-pay

## 2018-09-17 ENCOUNTER — Encounter: Payer: Self-pay | Admitting: Orthopaedic Surgery

## 2018-09-17 ENCOUNTER — Ambulatory Visit (INDEPENDENT_AMBULATORY_CARE_PROVIDER_SITE_OTHER): Payer: BC Managed Care – PPO | Admitting: Orthopaedic Surgery

## 2018-09-17 DIAGNOSIS — M25511 Pain in right shoulder: Secondary | ICD-10-CM

## 2018-09-17 DIAGNOSIS — M7541 Impingement syndrome of right shoulder: Secondary | ICD-10-CM | POA: Diagnosis not present
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
## 2018-09-17 NOTE — Addendum Note (Signed)
Addended by: Laurann Montana on: 09/17/2018 10:20 AM   Modules accepted: Orders

## 2018-09-27 ENCOUNTER — Ambulatory Visit: Payer: BC Managed Care – PPO | Admitting: Orthopaedic Surgery

## 2018-10-08 DIAGNOSIS — F319 Bipolar disorder, unspecified: Secondary | ICD-10-CM | POA: Diagnosis not present

## 2018-10-10 ENCOUNTER — Other Ambulatory Visit: Payer: BC Managed Care – PPO

## 2018-10-14 DIAGNOSIS — S46011A Strain of muscle(s) and tendon(s) of the rotator cuff of right shoulder, initial encounter: Secondary | ICD-10-CM | POA: Diagnosis not present

## 2018-10-14 DIAGNOSIS — M62511 Muscle wasting and atrophy, not elsewhere classified, right shoulder: Secondary | ICD-10-CM | POA: Diagnosis not present

## 2018-10-14 DIAGNOSIS — M898X2 Other specified disorders of bone, upper arm: Secondary | ICD-10-CM | POA: Diagnosis not present

## 2018-10-14 DIAGNOSIS — S43421A Sprain of right rotator cuff capsule, initial encounter: Secondary | ICD-10-CM | POA: Diagnosis not present

## 2018-10-15 ENCOUNTER — Encounter: Payer: Self-pay | Admitting: Orthopaedic Surgery

## 2018-10-15 ENCOUNTER — Ambulatory Visit (INDEPENDENT_AMBULATORY_CARE_PROVIDER_SITE_OTHER): Payer: BC Managed Care – PPO | Admitting: Orthopaedic Surgery

## 2018-10-15 ENCOUNTER — Other Ambulatory Visit: Payer: Self-pay

## 2018-10-15 DIAGNOSIS — M75101 Unspecified rotator cuff tear or rupture of right shoulder, not specified as traumatic: Secondary | ICD-10-CM

## 2018-10-15 DIAGNOSIS — IMO0002 Reserved for concepts with insufficient information to code with codable children: Secondary | ICD-10-CM
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
## 2018-12-10 DIAGNOSIS — F319 Bipolar disorder, unspecified: Secondary | ICD-10-CM | POA: Diagnosis not present

## 2018-12-10 DIAGNOSIS — F3181 Bipolar II disorder: Secondary | ICD-10-CM | POA: Diagnosis not present

## 2019-01-07 ENCOUNTER — Other Ambulatory Visit: Payer: Self-pay

## 2019-01-07 DIAGNOSIS — Z20822 Contact with and (suspected) exposure to covid-19: Secondary | ICD-10-CM

## 2019-01-09 LAB — NOVEL CORONAVIRUS, NAA: SARS-CoV-2, NAA: NOT DETECTED

## 2019-03-13 DIAGNOSIS — F319 Bipolar disorder, unspecified: Secondary | ICD-10-CM | POA: Diagnosis not present

## 2019-05-01 ENCOUNTER — Other Ambulatory Visit: Payer: Self-pay

## 2019-05-01 ENCOUNTER — Ambulatory Visit: Payer: BC Managed Care – PPO | Admitting: Medical

## 2019-05-01 ENCOUNTER — Encounter: Payer: Self-pay | Admitting: Medical

## 2019-05-01 VITALS — BP 155/90 | HR 94 | Temp 98.2°F | Ht 69.0 in | Wt 180.0 lb

## 2019-05-01 DIAGNOSIS — R1084 Generalized abdominal pain: Secondary | ICD-10-CM | POA: Diagnosis not present

## 2019-05-01 DIAGNOSIS — R195 Other fecal abnormalities: Secondary | ICD-10-CM | POA: Diagnosis not present

## 2019-05-01 DIAGNOSIS — J988 Other specified respiratory disorders: Secondary | ICD-10-CM | POA: Insufficient documentation

## 2019-05-01 NOTE — Progress Notes (Signed)
Subjective:     Patient ID: Jesse Hardin, male   DOB: 1960/11/06, 59 y.o.   MRN: [id number]  This visit type was conducted due to national recommendations for restrictions regarding the COVID-19 Pandemic (e.g. social distancing) in an effort to limit this patient's exposure and mitigate transmission in our community.  Due to their co-morbid illnesses, this patient is at least at moderate risk for complications without adequate follow up.  This format is felt to be most appropriate for this patient at this time.    Documentation for virtual audio and video telecommunications through Zoom encounter:  The patient was located at home. The provider was located in the office. The patient did consent to this visit and is aware of possible charges through their insurance for this visit.  The other persons participating in this telemedicine service were none. Time spent on call was 20 minutes and in review of previous records 20 minutes total.  This virtual service is not related to other E/M service within previous 7 days.   HPI Chief Complaint  Patient presents with  . Abdominal Pain    lower    Virtual consult today for abdominal pain and change in bowels.  He notes 3-day history of lower abdominal discomfort, at times generalized abdominal discomfort, feels like severe gas pains at times.  He had cold symptoms about a week ago with congestion, mild cough, but no fever.  No Covid contacts.  No other sick contacts.  Is working from home.  No nausea vomiting, no change in appetite, no body aches or chills.  No blood in the stool.  No urinary complaint.  No back pain.  Normally has 1 bowel movement that is solid every 1 to 2 days.  But these last 3 days he has had about 5-6 semisolid to loose stools daily.  Not particularly watery. No other aggravating or relieving factors. No other complaint.  Review of Systems As in subjective    Objective:   Physical Exam Due to coronavirus pandemic  stay at home measures, patient visit was virtual and they were not examined in person.   BP (!) 155/90   Pulse 94   Temp 98.2 F (36.8 C)   Ht 5\' 9"  (1.753 m)   Wt 180 lb (81.6 kg)   BMI 26.58 kg/m      Assessment:     Encounter Diagnoses  Name Primary?  . Generalized abdominal pain Yes  . Respiratory tract infection   . Loose stools        Plan:     Currently his symptoms suggest mild change in stool it could be related to the recent respiratory illness last week in his body trying to get rid of waste.  Discussed other potential differential such as viral gastroenteritis, colitis, diverticulitis or other.  Based on the history today symptoms sound mild and do not suggest diverticulitis or colitis.  He will continue to hydrate well, can use Pepto-Bismol the next 2 days.  Discussed brat diet, clear liquids, and avoid heavy meals spicy or acidic meals the next several days.  If symptoms do not resolve the next 2 to 3 days or if worsening symptoms call back.  He does have diverticulosis on prior colonoscopy but no history of diverticulitis.  He is status post appendectomy in the past.  Raymen was seen today for abdominal pain.  Diagnoses and all orders for this visit:  Generalized abdominal pain  Respiratory tract infection  Loose stools

## 2019-05-20 DIAGNOSIS — N390 Urinary tract infection, site not specified: Secondary | ICD-10-CM | POA: Diagnosis not present

## 2019-05-20 DIAGNOSIS — R1084 Generalized abdominal pain: Secondary | ICD-10-CM | POA: Diagnosis not present

## 2019-05-20 DIAGNOSIS — K5900 Constipation, unspecified: Secondary | ICD-10-CM | POA: Diagnosis not present

## 2019-05-20 DIAGNOSIS — A499 Bacterial infection, unspecified: Secondary | ICD-10-CM | POA: Diagnosis not present

## 2019-06-12 DIAGNOSIS — F319 Bipolar disorder, unspecified: Secondary | ICD-10-CM | POA: Diagnosis not present

## 2019-07-19 ENCOUNTER — Other Ambulatory Visit: Payer: Self-pay | Admitting: Medical

## 2019-08-10 DIAGNOSIS — Z9109 Other allergy status, other than to drugs and biological substances: Secondary | ICD-10-CM | POA: Diagnosis not present

## 2019-08-10 DIAGNOSIS — Z9189 Other specified personal risk factors, not elsewhere classified: Secondary | ICD-10-CM | POA: Diagnosis not present

## 2019-08-10 DIAGNOSIS — J069 Acute upper respiratory infection, unspecified: Secondary | ICD-10-CM | POA: Diagnosis not present

## 2019-09-26 ENCOUNTER — Other Ambulatory Visit: Payer: Self-pay

## 2019-09-26 ENCOUNTER — Telehealth: Payer: Self-pay | Admitting: Family Medicine

## 2019-09-26 ENCOUNTER — Ambulatory Visit (INDEPENDENT_AMBULATORY_CARE_PROVIDER_SITE_OTHER): Payer: Self-pay | Admitting: Family Medicine

## 2019-09-26 ENCOUNTER — Encounter: Payer: Self-pay | Admitting: Family Medicine

## 2019-09-26 VITALS — Temp 98.0°F | Wt 180.0 lb

## 2019-09-26 VITALS — BP 130/80 | HR 83 | Temp 97.7°F | Wt 192.8 lb

## 2019-09-26 DIAGNOSIS — J029 Acute pharyngitis, unspecified: Secondary | ICD-10-CM

## 2019-09-26 LAB — POCT RAPID STREP A (OFFICE): Rapid Strep A Screen: NEGATIVE

## 2019-09-26 NOTE — Progress Notes (Signed)
   Subjective:    Patient ID: Jesse Hardin, male    DOB: 10-21-1960, 59 y.o.   MRN: [id number]  HPI He has a 6-day history of started with sore throat and some postnasal drainage but no fever, chills, earache, nasal congestion, sneezing or rhinorrhea.  He has been using OTC medications for this.   Review of Systems     Objective:   Physical Exam Alert and in no distress. Tympanic membranes and canals are normal. Pharyngeal area is normal. Neck is supple without adenopathy or thyromegaly. Cardiac exam shows a regular sinus rhythm without murmurs or gallops. Lungs are clear to auscultation. Strep screen is negative       Assessment & Plan:  Sore throat - Plan: Rapid Strep A I explained that I thought this was mainly viral and to treat symptomatically.  If he continues have difficulty next week, I will consider placing him on an antibiotic.  He was comfortable with that.

## 2019-09-29 NOTE — Progress Notes (Signed)
0

## 2020-03-30 ENCOUNTER — Ambulatory Visit: Payer: Managed Care, Other (non HMO) | Admitting: Family Medicine

## 2020-03-30 ENCOUNTER — Other Ambulatory Visit: Payer: Self-pay

## 2020-03-30 ENCOUNTER — Encounter: Payer: Self-pay | Admitting: Family Medicine

## 2020-03-30 VITALS — BP 122/84 | HR 82 | Temp 98.2°F | Wt 188.8 lb

## 2020-03-30 DIAGNOSIS — K635 Polyp of colon: Secondary | ICD-10-CM | POA: Diagnosis not present

## 2020-03-30 DIAGNOSIS — K5792 Diverticulitis of intestine, part unspecified, without perforation or abscess without bleeding: Secondary | ICD-10-CM

## 2020-03-30 DIAGNOSIS — K649 Unspecified hemorrhoids: Secondary | ICD-10-CM | POA: Insufficient documentation

## 2020-03-30 DIAGNOSIS — K5790 Diverticulosis of intestine, part unspecified, without perforation or abscess without bleeding: Secondary | ICD-10-CM | POA: Diagnosis not present

## 2020-03-30 LAB — HEMOCCULT GUIAC POC 1CARD (OFFICE): Fecal Occult Blood, POC: NEGATIVE

## 2020-03-30 MED ORDER — AMOXICILLIN-POT CLAVULANATE 875-125 MG PO TABS: 1.0000 | ORAL_TABLET | Freq: Two times a day (BID) | ORAL | 0 refills | Status: DC

## 2020-03-30 NOTE — Progress Notes (Signed)
   Subjective:    Patient ID: Jesse Hardin, male    DOB: 27-Nov-1960, 59 y.o.   MRN: [id number]  HPI He states that since Christmas he has had intermittent left lower quadrant pain with an urge to have a BM.  No fever, chills, nausea, vomiting or seeing blood in his stool.  He seen no blood in his stool.  Review of his record indicates he has a previous history of diverticulosis, colonic polyp although I do not see the path report as well as hemorrhoids.  That was in 2013.   Review of Systems     Objective:   Physical Exam Alert and in no distress.  Cardiac exam shows regular rhythm without murmurs or gallops.  Lungs are clear to auscultation.  Abdominal exam shows decreased bowel sounds with left lower quadrant tenderness and slight rebound.  Rectal exam shows dark stool that was guaiac negative       Assessment & Plan:  Diverticulitis - Plan: POCT occult blood stool, amoxicillin-clavulanate (AUGMENTIN) 875-125 MG tablet  Diverticulosis  Hemorrhoids, unspecified hemorrhoid type  Polyp of colon, unspecified part of colon, unspecified type I discussed the diagnosis of diverticulitis in regard to further evaluation including x-rays, blood work etc.  At this point uncomfortable treating him and following up with him concerning that.  We will also get the path report to determine whether he needs colonoscopy or can wait another couple years. At the end of the encounter he then mentioned something about switching to a different psychotropic medication.  He states that Lexapro and Wellbutrin tended to work better than anything else he is tried.  Recommend he follow-up with that with [facility]

## 2020-05-06 ENCOUNTER — Other Ambulatory Visit: Payer: Self-pay

## 2020-05-06 ENCOUNTER — Encounter: Payer: Self-pay | Admitting: Family Medicine

## 2020-05-06 ENCOUNTER — Telehealth: Payer: BC Managed Care – PPO | Admitting: Family Medicine

## 2020-05-06 VITALS — Temp 98.7°F | Wt 185.0 lb

## 2020-05-06 DIAGNOSIS — R059 Cough, unspecified: Secondary | ICD-10-CM

## 2020-05-06 DIAGNOSIS — U071 COVID-19: Secondary | ICD-10-CM

## 2020-05-06 MED ORDER — BENZONATATE 200 MG PO CAPS: 200.0000 mg | ORAL_CAPSULE | Freq: Three times a day (TID) | ORAL | 0 refills | Status: DC | PRN

## 2020-05-06 NOTE — Progress Notes (Signed)
Start time: 1:33 End time: 1:58  Virtual Visit via Video Note  I connected with Jesse Hardin on 05/06/20 by a video enabled telemedicine application and verified that I am speaking with the correct person using two identifiers.  Location: Patient: home Provider: office   I discussed the limitations of evaluation and management by telemedicine and the availability of in person appointments. The patient expressed understanding and agreed to proceed.  History of Present Illness:  Chief Complaint  Patient presents with  . Covid Positive    Tested positive for covid Monday. Has a really bad cough. Goes through coughing spells, exhausted- feeling dizzy, nasal congestion, runny nose and sore throat.    One week ago, his husband tested positive for COVID. 4 days ago he started not feeling well, fatigue, muscle pain.  Monday morning he woke up "and felt like crap"--headache, myalgias, coughing.   No fever or chills. Upset stomach--nausea, no diarrhea. Tested + Monday. Not getting worse.  Today he is still exhausted.  Feels shaky and a little swimmy-headed when he gets up from laying down. Coughing spells--deep cough, dry and hacky.  He has gagged from the coughing spell, no vomiting. No shortness of breath, but reports being slightly wheezy. He has felt similarly when springtime allergies flared, and didn't find benefit from inhaler.  Vaccinated x 3 for COVID  OTC meds used: Tylenol and Naproxen for headache and body aches Also has taken excedrin migraine Mucinex nighttime cough medicine--reports taking 1/2-2/3 of a full dose, helps with cough. Has a major project--med makes him sleepy the next day,so taking lower dose.  The Mucinex liquid he has contains: acetaminophen, DM, triprolidine (antihistamine), it doesn't have guaifenesin.  PMH, PSH, SH reviewed  Outpatient Encounter Medications as of 05/06/2020  Medication Sig Note  . carbamazepine (EQUETRO) 200 MG CP12 12 hr capsule  Take 200 mg by mouth daily. 05/06/2020: Taking 1 capsule every other day. Weaning off med  . CVS D3 25 MCG (1000 UT) capsule TAKE 2 SOFTGELS (2,000 UNITS TOTAL) BY MOUTH DAILY.   . [DISCONTINUED] amoxicillin-clavulanate (AUGMENTIN) 875-125 MG tablet Take 1 tablet by mouth 2 (two) times daily.   . [DISCONTINUED] rosuvastatin (CRESTOR) 10 MG tablet Take 1 tablet (10 mg total) by mouth at bedtime.    No facility-administered encounter medications on file as of 05/06/2020.   No Known Allergies  ROS: URI symptoms, fatigue, cough, dizziness and mild nausea per HPI. No vomiting, diarrhea, abdominal pain, urinary complaints. No rash. NO chest pain, shortness of breath, some tightness per HPI No leg swelling, pain with breathing.   Observations/Objective:  Temp 98.7 F (37.1 C)   Wt 185 lb (83.9 kg)   BMI 27.32 kg/m   Pleasant male, in good spirits. His voice sounds somewhat nasal. Rare cough (1 episode) during visit. He is alert, oriented, and in no distress Exam is limited due to virtual nature of the visit.   Assessment and Plan:  COVID-19 virus infection - supportive measures reviewed, and red flags for complications for which he should see immediate care reviewed  Cough - Plan: benzonatate (TESSALON) 200 MG capsule   Sudafed  Guaifenesin--has Mucinex DM 12 hr. Tessalon Stop liquid mucinex Hydration Discussed red flags, possible complications    Follow Up Instructions:    I discussed the assessment and treatment plan with the patient. The patient was provided an opportunity to ask questions and all were answered. The patient agreed with the plan and demonstrated an understanding of the instructions.   The  patient was advised to call back or seek an in-person evaluation if the symptoms worsen or if the condition fails to improve as anticipated.  I spent 28 minutes dedicated to the care of this patient, including pre-visit review of records, face to face time, post-visit  ordering of testing and documentation.   Vikki Ports, MD

## 2020-05-06 NOTE — Patient Instructions (Signed)
Be sure to stay well hydrated--drink lots of water. Goal is to have very light/pale/clear urine.  As we discussed, you may use sudafed if needed for runny nose, sinus congestion/pain. You may use Mucinex DM (I believe you have the 12 hour version at home) twice daily to help with cough. Do NOT continue to take the liquid mucinex (it will overlap the DM ingredient, and it is likely the antihistamine it contains that makes you sleepy). If this isn't helping with the cough, you may use the tessalon (benzonatate) prescription that was sent in for you. You may use that up to three times daily, if needed, and can be used together with the Mucinex, if needed.  Seek immediate care if you have chest pain, shortness of breath, pain with breathing, high fevers, painful swollen leg, neurologic changes (numbness, weakness, balance problems, trouble speaking/thinking).  It was a pleasure meeting you.  I hope you feel better soon!

## 2020-07-08 ENCOUNTER — Encounter: Payer: Self-pay | Admitting: Medical

## 2020-07-08 ENCOUNTER — Ambulatory Visit: Payer: BC Managed Care – PPO | Admitting: Medical

## 2020-07-08 ENCOUNTER — Other Ambulatory Visit: Payer: Self-pay

## 2020-07-08 VITALS — BP 138/84 | HR 88 | Ht 69.0 in | Wt 187.6 lb

## 2020-07-08 DIAGNOSIS — E785 Hyperlipidemia, unspecified: Secondary | ICD-10-CM

## 2020-07-08 DIAGNOSIS — R41840 Attention and concentration deficit: Secondary | ICD-10-CM

## 2020-07-08 DIAGNOSIS — R4589 Other symptoms and signs involving emotional state: Secondary | ICD-10-CM | POA: Diagnosis not present

## 2020-07-08 DIAGNOSIS — R1310 Dysphagia, unspecified: Secondary | ICD-10-CM | POA: Diagnosis not present

## 2020-07-08 MED ORDER — METHYLPHENIDATE HCL ER (LA) 20 MG PO CP24: 20.0000 mg | ORAL_CAPSULE | ORAL | 0 refills | Status: DC

## 2020-07-08 MED ORDER — BUPROPION HCL ER (XL) 150 MG PO TB24: 150.0000 mg | ORAL_TABLET | Freq: Every day | ORAL | 1 refills | Status: DC

## 2020-07-08 MED ORDER — ROSUVASTATIN CALCIUM 10 MG PO TABS: 10.0000 mg | ORAL_TABLET | Freq: Every day | ORAL | 0 refills | Status: DC

## 2020-07-08 NOTE — Progress Notes (Signed)
Order has been placed for swallow study.
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
## 2020-07-09 ENCOUNTER — Telehealth: Payer: Self-pay

## 2020-07-09 ENCOUNTER — Other Ambulatory Visit (HOSPITAL_COMMUNITY): Payer: Self-pay

## 2020-07-09 DIAGNOSIS — R1310 Dysphagia, unspecified: Secondary | ICD-10-CM

## 2020-07-09 NOTE — Telephone Encounter (Signed)
P.A. METHYLPHENIDATE ER

## 2020-07-14 ENCOUNTER — Encounter: Payer: Self-pay | Admitting: Medical

## 2020-07-19 NOTE — Telephone Encounter (Signed)
P.A. approved til 07/09/21, sent pt my chart message

## 2020-07-20 ENCOUNTER — Telehealth: Payer: Self-pay

## 2020-07-20 ENCOUNTER — Ambulatory Visit (HOSPITAL_COMMUNITY)
Admission: RE | Admit: 2020-07-20 | Discharge: 2020-07-20 | Disposition: A | Discharge status: Home / Self Care | Payer: BC Managed Care – PPO | Source: Ambulatory Visit | Attending: Medical | Admitting: Medical

## 2020-07-20 ENCOUNTER — Other Ambulatory Visit: Payer: Self-pay

## 2020-07-20 ENCOUNTER — Ambulatory Visit (HOSPITAL_COMMUNITY): Admission: RE | Admit: 2020-07-20 | Payer: BC Managed Care – PPO | Source: Ambulatory Visit

## 2020-07-20 DIAGNOSIS — R1310 Dysphagia, unspecified: Secondary | ICD-10-CM | POA: Insufficient documentation

## 2020-07-20 NOTE — Telephone Encounter (Signed)
Please sign off on imaging order that was changed. Due to patient symptoms, it appears patient needed to have DG Esophagus

## 2020-07-21 ENCOUNTER — Other Ambulatory Visit: Payer: Self-pay

## 2020-07-21 DIAGNOSIS — K229 Disease of esophagus, unspecified: Secondary | ICD-10-CM

## 2020-07-21 NOTE — Progress Notes (Signed)
NA

## 2020-07-22 ENCOUNTER — Encounter: Payer: Self-pay | Admitting: Gastroenterology

## 2020-07-22 ENCOUNTER — Ambulatory Visit (INDEPENDENT_AMBULATORY_CARE_PROVIDER_SITE_OTHER): Payer: BC Managed Care – PPO | Admitting: Gastroenterology

## 2020-07-22 VITALS — BP 132/80 | HR 80 | Ht 67.5 in | Wt 184.2 lb

## 2020-07-22 DIAGNOSIS — K222 Esophageal obstruction: Secondary | ICD-10-CM

## 2020-07-22 DIAGNOSIS — R933 Abnormal findings on diagnostic imaging of other parts of digestive tract: Secondary | ICD-10-CM | POA: Diagnosis not present

## 2020-07-22 DIAGNOSIS — R1319 Other dysphagia: Secondary | ICD-10-CM

## 2020-07-22 MED ORDER — PANTOPRAZOLE SODIUM 40 MG PO TBEC: 40.0000 mg | DELAYED_RELEASE_TABLET | Freq: Two times a day (BID) | ORAL | 2 refills | Status: DC

## 2020-07-22 NOTE — Patient Instructions (Addendum)
It was a pleasure to meet you today. Based on our discussion, I am providing you with my recommendations below:  RECOMMENDATION(S):    I will send a prescription to your pharmacy for Pantoprazole. Please refer to the instructions I have provided below.   Take small bites, chew thoroughly, eat slowly, and drink a sip of water between bites   To better evaluate your symptoms, I am recommending an Endoscopy with dilation  PRESCRIPTION MEDICATION(S):   We have sent the following medication(s) to your pharmacy:  . Pantoprazole - please take 40mg  by mouth 2 times daily  NOTE: If your medication(s) requires a PRIOR AUTHORIZATION, we will receive notification from your pharmacy. Once received, the process to submit for approval may take up to 7-10 business days. You will be contacted about any denials we have received from your insurance company as well as alternatives recommended by your provider.  ENDOSCOPY:   . You have been scheduled for an endoscopy. Please follow written instructions given to you at your visit today.  INHALERS:   . If you use inhalers (even only as needed), please bring them with you on the day of your procedure.  FOLLOW UP:  . Once I have reviewed your reports, you will receive a call regarding when I would like to see you in follow up.  BMI:  . If you are age 69 or younger, your body mass index should be between 19-25. Your Body mass index is 28.43 kg/m. If this is out of the aformentioned range listed, please consider follow up with your Primary Care Provider.   Thank you for trusting me with your gastrointestinal care!    Thornton Park, MD, MPH
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
## 2020-07-27 ENCOUNTER — Other Ambulatory Visit: Payer: Self-pay | Admitting: Gastroenterology

## 2020-07-27 ENCOUNTER — Other Ambulatory Visit: Payer: Self-pay

## 2020-07-27 ENCOUNTER — Encounter: Payer: Self-pay | Admitting: Gastroenterology

## 2020-07-27 ENCOUNTER — Ambulatory Visit (AMBULATORY_SURGERY_CENTER): Payer: BC Managed Care – PPO | Admitting: Gastroenterology

## 2020-07-27 VITALS — BP 115/60 | HR 63 | Temp 97.3°F | Resp 14 | Ht 67.0 in | Wt 184.0 lb

## 2020-07-27 DIAGNOSIS — K2960 Other gastritis without bleeding: Secondary | ICD-10-CM | POA: Diagnosis not present

## 2020-07-27 DIAGNOSIS — K222 Esophageal obstruction: Secondary | ICD-10-CM

## 2020-07-27 DIAGNOSIS — K449 Diaphragmatic hernia without obstruction or gangrene: Secondary | ICD-10-CM | POA: Diagnosis not present

## 2020-07-27 DIAGNOSIS — R1319 Other dysphagia: Secondary | ICD-10-CM

## 2020-07-27 MED ORDER — SODIUM CHLORIDE 0.9 % IV SOLN
500.0000 mL | Freq: Once | INTRAVENOUS | Status: DC
Start: 2020-07-27 — End: 2020-07-27

## 2020-07-27 NOTE — Progress Notes (Signed)
VS by CW  Pt's states no medical or surgical changes since previsit or office visit.  

## 2020-07-27 NOTE — Patient Instructions (Signed)
Handout given:  Post dilation diet, Hiatal hernia Follow dilation diet for the entire day Do not take aspirin, ibuprofen, aleve, or any other NSAID's Follow up in office in 8 weeks Continue taking pantoprazole twice daily  YOU HAD AN ENDOSCOPIC PROCEDURE TODAY AT [place]:   Refer to the procedure report that was given to you for any specific questions about what was found during the examination.  If the procedure report does not answer your questions, please call your gastroenterologist to clarify.  If you requested that your care partner not be given the details of your procedure findings, then the procedure report has been included in a sealed envelope for you to review at your convenience later.  YOU SHOULD EXPECT: Some feelings of bloating in the abdomen. Passage of more gas than usual.  Walking can help get rid of the air that was put into your GI tract during the procedure and reduce the bloating. If you had a lower endoscopy (such as a colonoscopy or flexible sigmoidoscopy) you may notice spotting of blood in your stool or on the toilet paper. If you underwent a bowel prep for your procedure, you may not have a normal bowel movement for a few days.  Please Note:  You might notice some irritation and congestion in your nose or some drainage.  This is from the oxygen used during your procedure.  There is no need for concern and it should clear up in a day or so.  SYMPTOMS TO REPORT IMMEDIATELY:   Following upper endoscopy (EGD)  Vomiting of blood or coffee ground material  New chest pain or pain under the shoulder blades  Painful or persistently difficult swallowing  New shortness of breath  Fever of 100F or higher  Black, tarry-looking stools  For urgent or emergent issues, a gastroenterologist can be reached at any hour by calling [phone]. Do not use MyChart messaging for urgent concerns.    DIET:  We do recommend a small meal at first, but then you may  proceed to your regular diet.  Drink plenty of fluids but you should avoid alcoholic beverages for 24 hours.  ACTIVITY:  You should plan to take it easy for the rest of today and you should NOT DRIVE or use heavy machinery until tomorrow (because of the sedation medicines used during the test).    FOLLOW UP: Our staff will call the number listed on your records 48-72 hours following your procedure to check on you and address any questions or concerns that you may have regarding the information given to you following your procedure. If we do not reach you, we will leave a message.  We will attempt to reach you two times.  During this call, we will ask if you have developed any symptoms of COVID 19. If you develop any symptoms (ie: fever, flu-like symptoms, shortness of breath, cough etc.) before then, please call [phone].  If you test positive for Covid 19 in the 2 weeks post procedure, please call and report this information to [place].    If any biopsies were taken you will be contacted by phone or by letter within the next 1-3 weeks.  Please call us at [phone] if you have not heard about the biopsies in 3 weeks.   SIGNATURES/CONFIDENTIALITY: You and/or your care partner have signed paperwork which will be entered into your electronic medical record.  These signatures attest to the fact that that the information above on your After Visit  Summary has been reviewed and is understood.  Full responsibility of the confidentiality of this discharge information lies with you and/or your care-partner. 

## 2020-07-27 NOTE — Op Note (Signed)
[place] Patient Name: Jesse Hardin Procedure Date: 07/27/2020 10:07 AM MRN: [id number] Endoscopist: Thornton Park MD, MD Age: 60 Referring MD:  Date of Birth: 1960/12/10 Gender: Male Account #: [id number] Procedure:                Upper GI endoscopy Indications:              Dysphagia                           Abnormal esophagram: stricture at the GE junction                            preventing passage of a 41mm barium tablet Medicines:                Monitored Anesthesia Care Procedure:                Pre-Anesthesia Assessment:                           - Prior to the procedure, a History and Physical                            was performed, and patient medications and                            allergies were reviewed. The patient's tolerance of                            previous anesthesia was also reviewed. The risks                            and benefits of the procedure and the sedation                            options and risks were discussed with the patient.                            All questions were answered, and informed consent                            was obtained. Prior Anticoagulants: The patient has                            taken no previous anticoagulant or antiplatelet                            agents. ASA Grade Assessment: II - A patient with                            mild systemic disease. After reviewing the risks                            and benefits, the patient was deemed in  satisfactory condition to undergo the procedure.                           After obtaining informed consent, the endoscope was                            passed under direct vision. Throughout the                            procedure, the patient's blood pressure, pulse, and                            oxygen saturations were monitored continuously. The                            Endoscope was introduced through the mouth,  and                            advanced to the third part of duodenum. The upper                            GI endoscopy was accomplished without difficulty.                            The patient tolerated the procedure well. Scope In: Scope Out: Findings:                 One benign-appearing, intrinsic moderate                            (circumferential scarring or stenosis; an endoscope                            may pass) stricture was found 36 cm from the                            incisors. This stricture measured less than one cm                            (in length). The stricture was easily traversed. A                            TTS dilator was passed through the scope. Dilation                            with a 13.5-14.5-15.5 mm balloon dilator was                            performed to 15.5 mm. There was no resistance to a                            fully inflated balloon. The dilation site was  examined and showed mild mucosal disruption at the                            GE junction. Estimated blood loss was minimal.                           The z-line is slightly irregulat at 36 cm. The                            examined esophagus was otherwise normal. After                            dilation was performed, biopsies were taken from                            the mid/proximal and distal esophagus with a cold                            forceps for histology. Estimated blood loss was                            minimal.                           Patchy minimal inflammation characterized by                            erythema, friability and granularity was found in                            the gastric body and in the gastric antrum.                            Biopsies were taken from the antrum, body, and                            fundus with a cold forceps for histology. Estimated                            blood loss was minimal.                            A small-sized hiatal hernia was present.                           Patchy mildly erythematous mucosa without active                            bleeding and with no stigmata of bleeding was found                            in the duodenal bulb.  The cardia and gastric fundus were normal on                            retroflexion. Complications:            No immediate complications. Estimated blood loss:                            Minimal. Estimated Blood Loss:     Estimated blood loss was minimal. Impression:               - Benign-appearing esophageal stenosis. Dilated.                           - Normal esophagus. Biopsied.                           - Gastritis. Biopsied.                           - Small-sized hiatal hernia.                           - Erythematous duodenopathy. Recommendation:           - Patient has a contact number available for                            emergencies. The signs and symptoms of potential                            delayed complications were discussed with the                            patient. Return to normal activities tomorrow.                            Written discharge instructions were provided to the                            patient.                           - Advance diet as tolerated.                           - Continue present medications including                            pantoprazole 40 mg BID.                           - Await pathology results.                           - No aspirin, ibuprofen, naproxen, or other                            non-steroidal anti-inflammatory drugs.                           -  Await pathology results.                           - Follow-up in the office in 8 weeks, earlier if                            needed. Thornton Park MD, MD 07/27/2020 10:39:32 AM This report has been signed electronically.

## 2020-07-27 NOTE — Progress Notes (Signed)
Called to room to assist during endoscopic procedure.  Patient ID and intended procedure confirmed with present staff. Received instructions for my participation in the procedure from the performing physician.  

## 2020-07-27 NOTE — Progress Notes (Signed)
PT taken to PACU. Monitors in place. VSS. Report given to RN. 

## 2020-07-29 ENCOUNTER — Telehealth: Payer: Self-pay | Admitting: *Deleted

## 2020-07-29 NOTE — Telephone Encounter (Signed)
  Follow up Call-  Call back number 07/27/2020  Post procedure Call Back phone  # [phone]  Permission to leave phone message Yes  Some recent data might be hidden     Patient questions:  Do you have a fever, pain , or abdominal swelling? No. Pain Score  0 *  Have you tolerated food without any problems? Yes.    Have you been able to return to your normal activities? Yes.    Do you have any questions about your discharge instructions: Diet   No. Medications  No. Follow up visit  No.  Do you have questions or concerns about your Care? No.  Actions: * If pain score is 4 or above: 1. No action needed, pain <4.Have you developed a fever since your procedure? no  2.   Have you had an respiratory symptoms (SOB or cough) since your procedure? no  3.   Have you tested positive for COVID 19 since your procedure no  4.   Have you had any family members/close contacts diagnosed with the COVID 19 since your procedure?  no   If yes to any of these questions please route to Joylene John, RN and Joella Prince, RN

## 2020-07-30 ENCOUNTER — Other Ambulatory Visit: Payer: Self-pay | Admitting: Medical

## 2020-08-04 ENCOUNTER — Encounter: Payer: Self-pay | Admitting: Gastroenterology

## 2020-08-05 ENCOUNTER — Other Ambulatory Visit: Payer: Self-pay

## 2020-08-05 ENCOUNTER — Encounter: Payer: Self-pay | Admitting: Medical

## 2020-08-05 ENCOUNTER — Ambulatory Visit: Payer: BC Managed Care – PPO | Admitting: Medical

## 2020-08-05 VITALS — BP 130/82 | HR 80 | Ht 67.0 in | Wt 181.2 lb

## 2020-08-05 DIAGNOSIS — E785 Hyperlipidemia, unspecified: Secondary | ICD-10-CM

## 2020-08-05 DIAGNOSIS — R41840 Attention and concentration deficit: Secondary | ICD-10-CM

## 2020-08-05 DIAGNOSIS — R4589 Other symptoms and signs involving emotional state: Secondary | ICD-10-CM | POA: Diagnosis not present

## 2020-08-05 DIAGNOSIS — F411 Generalized anxiety disorder: Secondary | ICD-10-CM

## 2020-08-05 DIAGNOSIS — R1310 Dysphagia, unspecified: Secondary | ICD-10-CM | POA: Diagnosis not present

## 2020-08-05 DIAGNOSIS — R7989 Other specified abnormal findings of blood chemistry: Secondary | ICD-10-CM

## 2020-08-05 MED ORDER — BUPROPION HCL ER (XL) 300 MG PO TB24: 300.0000 mg | ORAL_TABLET | Freq: Every day | ORAL | 2 refills | Status: DC

## 2020-08-05 MED ORDER — METHYLPHENIDATE HCL ER (LA) 30 MG PO CP24: 30.0000 mg | ORAL_CAPSULE | ORAL | 0 refills | Status: DC
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
## 2020-08-06 LAB — TESTOSTERONE: Testosterone: 196 ng/dL — ABNORMAL LOW (ref 264–916)

## 2020-08-24 ENCOUNTER — Telehealth: Payer: Self-pay | Admitting: Medical

## 2020-08-24 NOTE — Telephone Encounter (Signed)
Requested records received from [facility],

## 2020-08-28 ENCOUNTER — Other Ambulatory Visit: Payer: Self-pay | Admitting: Medical

## 2020-09-03 ENCOUNTER — Other Ambulatory Visit: Payer: Self-pay | Admitting: Medical

## 2020-09-03 MED ORDER — METHYLPHENIDATE HCL ER (LA) 30 MG PO CP24: 30.0000 mg | ORAL_CAPSULE | ORAL | 0 refills | Status: DC

## 2020-09-03 MED ORDER — METHYLPHENIDATE HCL ER (LA) 30 MG PO CP24: 30.0000 mg | ORAL_CAPSULE | Freq: Every day | ORAL | 0 refills | Status: DC

## 2020-09-29 ENCOUNTER — Encounter: Payer: Self-pay | Admitting: Gastroenterology

## 2020-09-29 ENCOUNTER — Ambulatory Visit: Payer: BC Managed Care – PPO | Admitting: Gastroenterology

## 2020-09-29 VITALS — BP 98/66 | HR 79 | Ht 68.0 in | Wt 175.0 lb

## 2020-09-29 DIAGNOSIS — K2100 Gastro-esophageal reflux disease with esophagitis, without bleeding: Secondary | ICD-10-CM | POA: Diagnosis not present

## 2020-09-29 DIAGNOSIS — K222 Esophageal obstruction: Secondary | ICD-10-CM

## 2020-09-29 NOTE — Patient Instructions (Addendum)
It was my pleasure to provide care to you you today. Based on our discussion, I am providing you with my recommendations below:  RECOMMENDATION(S):   You have been scheduled for an endoscopy and a colonoscopy. Please follow the written instructions given to you at your visit today.  PREP:   Please pick up your prep supplies at the pharmacy within the next 1-3 days.  INHALERS:   If you use inhalers (even only as needed), please bring them with you on the day of your procedure.  COLONOSCOPY TIPS:  To reduce nausea and dehydration, stay well hydrated for 3-4 days prior to the exam.  To prevent skin/hemorrhoid irritation - prior to wiping, put A&Dointment or vaseline on the toilet paper. Keep a towel or pad on the bed.  BEFORE STARTING YOUR PREP, drink  64oz of clear liquids in the morning. This will help to flush the colon and will ensure you are well hydrated!!!!  NOTE - This is in addition to the fluids required for to complete your prep. Use of a flavored hard candy, such as grape Anise Salvo, can counteract some of the flavor of the prep and may prevent some nausea.   FOLLOW UP:  After your procedure, you will receive a call from my office staff regarding my recommendation for follow up.  BMI:  If you are age 74 or younger, your body mass index should be between 19-25. Your Body mass index is 26.61 kg/m. If this is out of the aformentioned range listed, please consider follow up with your Primary Care Provider.   MY CHART:  The [facility] providers would like to encourage you to use [facility] to communicate with providers for non-urgent requests or questions.  Due to long hold times on the telephone, sending your provider a message by [facility] may be a faster and more efficient way to get a response.  Please allow 48 business hours for a response.  Please remember that this is for non-urgent requests.   Thank you for trusting me with your gastrointestinal care!    Thornton Park, MD, MPH
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
## 2020-10-03 ENCOUNTER — Other Ambulatory Visit: Payer: Self-pay | Admitting: Medical

## 2020-10-11 ENCOUNTER — Encounter: Payer: BC Managed Care – PPO | Admitting: Medical

## 2020-10-15 ENCOUNTER — Other Ambulatory Visit: Payer: Self-pay | Admitting: Gastroenterology

## 2020-10-15 DIAGNOSIS — R1319 Other dysphagia: Secondary | ICD-10-CM

## 2020-10-15 DIAGNOSIS — K222 Esophageal obstruction: Secondary | ICD-10-CM

## 2020-10-15 DIAGNOSIS — R933 Abnormal findings on diagnostic imaging of other parts of digestive tract: Secondary | ICD-10-CM

## 2020-11-04 ENCOUNTER — Other Ambulatory Visit: Payer: Self-pay

## 2020-11-04 MED ORDER — METHYLPHENIDATE HCL ER (LA) 30 MG PO CP24: 30.0000 mg | ORAL_CAPSULE | Freq: Every day | ORAL | 0 refills | Status: DC

## 2020-11-24 ENCOUNTER — Other Ambulatory Visit: Payer: Self-pay

## 2020-11-24 ENCOUNTER — Ambulatory Visit: Payer: BC Managed Care – PPO | Admitting: Medical

## 2020-11-24 VITALS — BP 120/82 | HR 81 | Ht 68.5 in | Wt 172.0 lb

## 2020-11-24 DIAGNOSIS — Z0000 Encounter for general adult medical examination without abnormal findings: Secondary | ICD-10-CM | POA: Diagnosis not present

## 2020-11-24 DIAGNOSIS — Z136 Encounter for screening for cardiovascular disorders: Secondary | ICD-10-CM | POA: Diagnosis not present

## 2020-11-24 DIAGNOSIS — R7989 Other specified abnormal findings of blood chemistry: Secondary | ICD-10-CM | POA: Insufficient documentation

## 2020-11-24 DIAGNOSIS — Z125 Encounter for screening for malignant neoplasm of prostate: Secondary | ICD-10-CM

## 2020-11-24 DIAGNOSIS — E291 Testicular hypofunction: Secondary | ICD-10-CM

## 2020-11-24 DIAGNOSIS — F411 Generalized anxiety disorder: Secondary | ICD-10-CM | POA: Diagnosis not present

## 2020-11-24 DIAGNOSIS — R41840 Attention and concentration deficit: Secondary | ICD-10-CM | POA: Diagnosis not present

## 2020-11-24 DIAGNOSIS — Z8249 Family history of ischemic heart disease and other diseases of the circulatory system: Secondary | ICD-10-CM

## 2020-11-24 DIAGNOSIS — M5410 Radiculopathy, site unspecified: Secondary | ICD-10-CM

## 2020-11-24 DIAGNOSIS — K5790 Diverticulosis of intestine, part unspecified, without perforation or abscess without bleeding: Secondary | ICD-10-CM

## 2020-11-24 DIAGNOSIS — G4700 Insomnia, unspecified: Secondary | ICD-10-CM

## 2020-11-24 DIAGNOSIS — Z1211 Encounter for screening for malignant neoplasm of colon: Secondary | ICD-10-CM

## 2020-11-24 DIAGNOSIS — M79604 Pain in right leg: Secondary | ICD-10-CM

## 2020-11-24 DIAGNOSIS — K635 Polyp of colon: Secondary | ICD-10-CM

## 2020-11-24 DIAGNOSIS — R200 Anesthesia of skin: Secondary | ICD-10-CM | POA: Insufficient documentation

## 2020-11-24 DIAGNOSIS — Z7185 Encounter for immunization safety counseling: Secondary | ICD-10-CM

## 2020-11-24 DIAGNOSIS — M8580 Other specified disorders of bone density and structure, unspecified site: Secondary | ICD-10-CM

## 2020-11-24 DIAGNOSIS — F319 Bipolar disorder, unspecified: Secondary | ICD-10-CM

## 2020-11-24 DIAGNOSIS — E785 Hyperlipidemia, unspecified: Secondary | ICD-10-CM

## 2020-11-24 DIAGNOSIS — E559 Vitamin D deficiency, unspecified: Secondary | ICD-10-CM

## 2020-11-24 DIAGNOSIS — M859 Disorder of bone density and structure, unspecified: Secondary | ICD-10-CM
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
## 2020-11-25 ENCOUNTER — Encounter: Payer: Self-pay | Admitting: Medical

## 2020-11-25 ENCOUNTER — Other Ambulatory Visit: Payer: Self-pay | Admitting: Medical

## 2020-11-25 MED ORDER — BUPROPION HCL ER (XL) 300 MG PO TB24: 300.0000 mg | ORAL_TABLET | Freq: Every day | ORAL | 1 refills | Status: DC

## 2020-11-27 LAB — COMPREHENSIVE METABOLIC PANEL
ALT: 30 IU/L (ref 0–44)
AST: 29 IU/L (ref 0–40)
Albumin/Globulin Ratio: 2.3 — ABNORMAL HIGH (ref 1.2–2.2)
Albumin: 4.9 g/dL (ref 3.8–4.9)
Alkaline Phosphatase: 82 IU/L (ref 44–121)
BUN/Creatinine Ratio: 19 (ref 9–20)
BUN: 19 mg/dL (ref 6–24)
Bilirubin Total: 0.5 mg/dL (ref 0.0–1.2)
CO2: 24 mmol/L (ref 20–29)
Calcium: 9.9 mg/dL (ref 8.7–10.2)
Chloride: 106 mmol/L (ref 96–106)
Creatinine, Ser: 1.01 mg/dL (ref 0.76–1.27)
Globulin, Total: 2.1 g/dL (ref 1.5–4.5)
Glucose: 93 mg/dL (ref 65–99)
Potassium: 4.1 mmol/L (ref 3.5–5.2)
Sodium: 144 mmol/L (ref 134–144)
Total Protein: 7 g/dL (ref 6.0–8.5)
eGFR: 86 mL/min/{1.73_m2} (ref >59)

## 2020-11-27 LAB — CBC
Hematocrit: 44.7 % (ref 37.5–51.0)
Hemoglobin: 15.4 g/dL (ref 13.0–17.7)
MCH: 30 pg (ref 26.6–33.0)
MCHC: 34.5 g/dL (ref 31.5–35.7)
MCV: 87 fL (ref 79–97)
Platelets: 293 10*3/uL (ref 150–450)
RBC: 5.14 x10E6/uL (ref 4.14–5.80)
RDW: 13.6 % (ref 11.6–15.4)
WBC: 9.4 10*3/uL (ref 3.4–10.8)

## 2020-11-27 LAB — LIPID PANEL
Chol/HDL Ratio: 2.4 ratio (ref 0.0–5.0)
Cholesterol, Total: 145 mg/dL (ref 100–199)
HDL: 60 mg/dL (ref >39)
LDL Chol Calc (NIH): 68 mg/dL (ref 0–99)
Triglycerides: 89 mg/dL (ref 0–149)
VLDL Cholesterol Cal: 17 mg/dL (ref 5–40)

## 2020-11-27 LAB — TESTOSTERONE,FREE AND TOTAL
Testosterone, Free: 6.7 pg/mL — ABNORMAL LOW (ref 7.2–24.0)
Testosterone: 319 ng/dL (ref 264–916)

## 2020-11-27 LAB — PROLACTIN: Prolactin: 5.5 ng/mL (ref 4.0–15.2)

## 2020-11-27 LAB — PSA: Prostate Specific Ag, Serum: 0.4 ng/mL (ref 0.0–4.0)

## 2020-11-27 LAB — TSH: TSH: 1.97 u[IU]/mL (ref 0.450–4.500)

## 2020-11-30 ENCOUNTER — Other Ambulatory Visit: Payer: Self-pay | Admitting: Medical

## 2020-11-30 MED ORDER — JATENZO 158 MG PO CAPS: 1.0000 | ORAL_CAPSULE | Freq: Two times a day (BID) | ORAL | 1 refills | Status: DC

## 2020-12-01 ENCOUNTER — Telehealth: Payer: Self-pay | Admitting: Internal Medicine

## 2020-12-01 ENCOUNTER — Other Ambulatory Visit: Payer: Self-pay | Admitting: Medical

## 2020-12-01 MED ORDER — METHYLPHENIDATE HCL ER (LA) 30 MG PO CP24: 30.0000 mg | ORAL_CAPSULE | Freq: Every day | ORAL | 0 refills | Status: DC

## 2020-12-01 NOTE — Telephone Encounter (Signed)
Pt is asking for a refill on ritalin

## 2020-12-02 ENCOUNTER — Other Ambulatory Visit: Payer: Self-pay | Admitting: Medical

## 2020-12-04 ENCOUNTER — Telehealth: Payer: Self-pay

## 2020-12-04 NOTE — Telephone Encounter (Signed)
P.A. Gaynelle Cage

## 2020-12-12 ENCOUNTER — Encounter: Payer: Self-pay | Admitting: Certified Registered Nurse Anesthetist

## 2020-12-13 ENCOUNTER — Ambulatory Visit (AMBULATORY_SURGERY_CENTER): Payer: BC Managed Care – PPO | Admitting: Gastroenterology

## 2020-12-13 ENCOUNTER — Encounter: Payer: Self-pay | Admitting: Gastroenterology

## 2020-12-13 ENCOUNTER — Other Ambulatory Visit: Payer: Self-pay

## 2020-12-13 VITALS — BP 109/68 | HR 71 | Temp 98.4°F | Resp 16 | Ht 68.0 in | Wt 175.0 lb

## 2020-12-13 DIAGNOSIS — K222 Esophageal obstruction: Secondary | ICD-10-CM

## 2020-12-13 DIAGNOSIS — Z86010 Personal history of colonic polyps: Secondary | ICD-10-CM

## 2020-12-13 DIAGNOSIS — K2100 Gastro-esophageal reflux disease with esophagitis, without bleeding: Secondary | ICD-10-CM

## 2020-12-13 DIAGNOSIS — K2970 Gastritis, unspecified, without bleeding: Secondary | ICD-10-CM

## 2020-12-13 DIAGNOSIS — R1310 Dysphagia, unspecified: Secondary | ICD-10-CM | POA: Diagnosis not present

## 2020-12-13 DIAGNOSIS — D122 Benign neoplasm of ascending colon: Secondary | ICD-10-CM | POA: Diagnosis not present

## 2020-12-13 DIAGNOSIS — D120 Benign neoplasm of cecum: Secondary | ICD-10-CM

## 2020-12-13 DIAGNOSIS — Z1211 Encounter for screening for malignant neoplasm of colon: Secondary | ICD-10-CM

## 2020-12-13 MED ORDER — SODIUM CHLORIDE 0.9 % IV SOLN: 500.0000 mL | Freq: Once | INTRAVENOUS | Status: DC

## 2020-12-13 NOTE — Patient Instructions (Signed)
Please read handouts provided. Continue present medications, including pantoprazole 40 mg twice daily. Await pathology results. Clear liquid diet today, resume regular diet tomorrow. Return to office, next available appointment to review results.    YOU HAD AN ENDOSCOPIC PROCEDURE TODAY AT [facility]:   Refer to the procedure report that was given to you for any specific questions about what was found during the examination.  If the procedure report does not answer your questions, please call your gastroenterologist to clarify.  If you requested that your care partner not be given the details of your procedure findings, then the procedure report has been included in a sealed envelope for you to review at your convenience later.  YOU SHOULD EXPECT: Some feelings of bloating in the abdomen. Passage of more gas than usual.  Walking can help get rid of the air that was put into your GI tract during the procedure and reduce the bloating. If you had a lower endoscopy (such as a colonoscopy or flexible sigmoidoscopy) you may notice spotting of blood in your stool or on the toilet paper. If you underwent a bowel prep for your procedure, you may not have a normal bowel movement for a few days.  Please Note:  You might notice some irritation and congestion in your nose or some drainage.  This is from the oxygen used during your procedure.  There is no need for concern and it should clear up in a day or so.  SYMPTOMS TO REPORT IMMEDIATELY:  Following lower endoscopy (colonoscopy or flexible sigmoidoscopy):  Excessive amounts of blood in the stool  Significant tenderness or worsening of abdominal pains  Swelling of the abdomen that is new, acute  Fever of 100F or higher  Following upper endoscopy (EGD)  Vomiting of blood or coffee ground material  New chest pain or pain under the shoulder blades  Painful or persistently difficult swallowing  New shortness of breath  Fever of 100F  or higher  Black, tarry-looking stools  For urgent or emergent issues, a gastroenterologist can be reached at any hour by calling [phone]. Do not use MyChart messaging for urgent concerns.    DIET:  Drink plenty of fluids but you should avoid alcoholic beverages for 24 hours.  ACTIVITY:  You should plan to take it easy for the rest of today and you should NOT DRIVE or use heavy machinery until tomorrow (because of the sedation medicines used during the test).    FOLLOW UP: Our staff will call the number listed on your records 48-72 hours following your procedure to check on you and address any questions or concerns that you may have regarding the information given to you following your procedure. If we do not reach you, we will leave a message.  We will attempt to reach you two times.  During this call, we will ask if you have developed any symptoms of COVID 19. If you develop any symptoms (ie: fever, flu-like symptoms, shortness of breath, cough etc.) before then, please call [phone].  If you test positive for Covid 19 in the 2 weeks post procedure, please call and report this information to [place].    If any biopsies were taken you will be contacted by phone or by letter within the next 1-3 weeks.  Please call us at [phone] if you have not heard about the biopsies in 3 weeks.    SIGNATURES/CONFIDENTIALITY: You and/or your care partner have signed paperwork which will be entered into your  electronic medical record.  These signatures attest to the fact that that the information above on your After Visit Summary has been reviewed and is understood.  Full responsibility of the confidentiality of this discharge information lies with you and/or your care-partner.

## 2020-12-13 NOTE — Progress Notes (Signed)
Report given to PACU, vss 

## 2020-12-13 NOTE — Op Note (Addendum)
[place] Patient Name: Jesse Hardin Procedure Date: 12/13/2020 2:32 PM MRN: [id number] Endoscopist: Thornton Park MD, MD Age: 60 Referring MD:  Date of Birth: [birth year] Gender: Male Account #: [id number] Procedure:                Colonoscopy Indications:              Surveillance: Personal history of adenomatous                            polyps on last colonoscopy > 5 years ago                           Screening colonoscopy with Dr. Unk Lightning in [facility]                            06/26/2011: ascending colon polyp, left-sided                            diverticulosis, and grade 1-2 internal hemorrhoids.                            Surveillance colonoscopy recommended in 5 years. Medicines:                Monitored Anesthesia Care Procedure:                Pre-Anesthesia Assessment:                           - Prior to the procedure, a History and Physical                            was performed, and patient medications and                            allergies were reviewed. The patient's tolerance of                            previous anesthesia was also reviewed. The risks                            and benefits of the procedure and the sedation                            options and risks were discussed with the patient.                            All questions were answered, and informed consent                            was obtained. Prior Anticoagulants: The patient has                            taken no previous anticoagulant or antiplatelet  agents. ASA Grade Assessment: II - A patient with                            mild systemic disease. After reviewing the risks                            and benefits, the patient was deemed in                            satisfactory condition to undergo the procedure.                           After obtaining informed consent, the colonoscope                            was passed under  direct vision. Throughout the                            procedure, the patient's blood pressure, pulse, and                            oxygen saturations were monitored continuously. The                            CF [id number] was introduced through the anus                            and advanced to the 3 cm into the ileum. A second                            forward view of the right colon was performed. The                            colonoscopy was performed without difficulty. The                            patient tolerated the procedure well. The quality                            of the bowel preparation was good. The terminal                            ileum, ileocecal valve, appendiceal orifice, and                            rectum were photographed. Scope In: 2:49:03 PM Scope Out: 3:03:48 PM Scope Withdrawal Time: 0 hours 12 minutes 17 seconds  Total Procedure Duration: 0 hours 14 minutes 45 seconds  Findings:                 The perianal and digital rectal examinations were                            normal.  Multiple small and large-mouthed diverticula were                            found in the sigmoid colon and descending colon.                           Two sessile polyps were found in the proximal                            ascending colon. The polyps were 1 to 2 mm in size.                            One polyp was removed with a cold snare. The other                            polyp sat on the top of the fold of the IC valve                            and I was unable to remove it using a snare. It was                            ultimately removed with cold forceps. Resection and                            retrieval were complete. Estimated blood loss was                            minimal.                           Non-bleeding internal hemorrhoids were found.                           The exam was otherwise without abnormality on                             direct and retroflexion views. Complications:            No immediate complications. Estimated blood loss:                            Minimal. Estimated Blood Loss:     Estimated blood loss was minimal. Impression:               - Diverticulosis in the sigmoid colon and in the                            descending colon.                           - Two 1 to 2 mm polyps in the proximal ascending                            colon, removed with a cold  snare. Resected and                            retrieved.                           - Non-bleeding internal hemorrhoids.                           - The examination was otherwise normal on direct                            and retroflexion views. Recommendation:           - Patient has a contact number available for                            emergencies. The signs and symptoms of potential                            delayed complications were discussed with the                            patient. Return to normal activities tomorrow.                            Written discharge instructions were provided to the                            patient.                           - High fiber diet. Consider using a daily dose of                            Metamucil or Benefiber.                           - Continue present medications.                           - Await pathology results.                           - Repeat colonoscopy date to be determined after                            pending pathology results are reviewed for                            surveillance.                           - Emerging evidence supports eating a diet of                            fruits, vegetables, grains, calcium, and yogurt  while reducing red meat and alcohol may reduce the                            risk of colon cancer.                           - Thank you for allowing me to be involved in your                             colon cancer prevention. Thornton Park MD, MD 12/13/2020 3:17:27 PM This report has been signed electronically.
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
## 2020-12-13 NOTE — Progress Notes (Signed)
VS completed by CW.    Medical History reviewed and updated.

## 2020-12-13 NOTE — Progress Notes (Signed)
Called to room to assist during endoscopic procedure.  Patient ID and intended procedure confirmed with present staff. Received instructions for my participation in the procedure from the performing physician.  

## 2020-12-13 NOTE — Op Note (Signed)
[place] Patient Name: Jesse Hardin Procedure Date: 12/13/2020 2:34 PM MRN: [id number] Endoscopist: Thornton Park MD, MD Age: 60 Referring MD:  Date of Birth: 1960/09/08 Gender: Male Account #: [id number] Procedure:                Upper GI endoscopy Indications:              Dysphagia with known stricture, symptomatic relief                            from dilation with 13.5 to 15.5 mm balloon, but                            symptoms recurred 2 weeks later Medicines:                Monitored Anesthesia Care Procedure:                Pre-Anesthesia Assessment:                           - Prior to the procedure, a History and Physical                            was performed, and patient medications and                            allergies were reviewed. The patient's tolerance of                            previous anesthesia was also reviewed. The risks                            and benefits of the procedure and the sedation                            options and risks were discussed with the patient.                            All questions were answered, and informed consent                            was obtained. Prior Anticoagulants: The patient has                            taken no previous anticoagulant or antiplatelet                            agents. ASA Grade Assessment: II - A patient with                            mild systemic disease. After reviewing the risks                            and benefits, the patient was deemed in  satisfactory condition to undergo the procedure.                           After obtaining informed consent, the endoscope was                            passed under direct vision. Throughout the                            procedure, the patient's blood pressure, pulse, and                            oxygen saturations were monitored continuously. The                            GIF [id number]  [id number] was introduced through the                            mouth, and advanced to the third part of duodenum.                            The upper GI endoscopy was accomplished without                            difficulty. The patient tolerated the procedure                            well. Scope In: Scope Out: Findings:                 One benign-appearing, intrinsic mild                            (non-circumferential scarring) stenosis was found                            36 cm from the incisors. The stenosis was                            traversed. A TTS dilator was passed through the                            scope. Dilation with a 16-17-18 mm balloon dilator                            was performed to 18 mm. There was minimal                            resistance with a fully inflated balloon. The                            dilation site was examined and showed mild mucosal                            disruption. Biopsies  were then taken from the                            distal esophagus with a cold forceps for histology.                            Estimated blood loss was minimal.                           Minimal inflammation was found in the gastric body.                            Biopsies were not obtained as they showed chronic                            gastritis on prior EGD. .                           The duodenal bulb was normal.                           The cardia and gastric fundus were normal on                            retroflexion.                           The exam was otherwise without abnormality. Complications:            No immediate complications. Estimated blood loss:                            Minimal. Estimated Blood Loss:     Estimated blood loss was minimal. Impression:               - Benign-appearing esophageal stenosis. Dilated.                            Biopsied.                           - Gastritis.                           - Normal  duodenal bulb.                           - The examination was otherwise normal. Recommendation:           - Patient has a contact number available for                            emergencies. The signs and symptoms of potential                            delayed complications were discussed with the  patient. Return to normal activities tomorrow.                            Written discharge instructions were provided to the                            patient.                           - Clear liquid diet today. Resume prior diet                            tomorrow.                           - Continue present medications including                            pantoprazole 40 mg BID.                           - Await pathology results.                           - Consider repeat endoscopy for recurrent dysphagia.                           - Return to the office - next available to review                            these results. Thornton Park MD, MD 12/13/2020 3:13:50 PM This report has been signed electronically.

## 2020-12-15 ENCOUNTER — Telehealth: Payer: Self-pay

## 2020-12-15 NOTE — Telephone Encounter (Signed)
  Follow up Call-  Call back number 12/13/2020 07/27/2020  Post procedure Call Back phone  # [phone]  Permission to leave phone message Yes Yes  Some recent data might be hidden     Patient questions:  Do you have a fever, pain , or abdominal swelling? No. Pain Score  0 *  Have you tolerated food without any problems? Yes.    Have you been able to return to your normal activities? Yes.    Do you have any questions about your discharge instructions: Diet   No. Medications  No. Follow up visit  No.  Do you have questions or concerns about your Care? No.  Actions: * If pain score is 4 or above: No action needed, pain <4.  Have you developed a fever since your procedure? No   2.   Have you had an respiratory symptoms (SOB or cough) since your procedure? No   3.   Have you tested positive for COVID 19 since your procedure no   4.   Have you had any family members/close contacts diagnosed with the COVID 19 since your procedure?  No    If yes to any of these questions please route to Joylene John, RN and Joella Prince, RN

## 2020-12-18 NOTE — Telephone Encounter (Signed)
Per plan Gaynelle Cage not covered, no reason or alternative will call plan

## 2020-12-18 NOTE — Telephone Encounter (Signed)
P.A. denied, per plan Jatenzo not covered, no reason or alternative will call plan

## 2020-12-21 ENCOUNTER — Ambulatory Visit
Admission: RE | Admit: 2020-12-21 | Discharge: 2020-12-21 | Disposition: A | Discharge status: Home / Self Care | Payer: BC Managed Care – PPO | Source: Ambulatory Visit | Attending: Medical | Admitting: Medical

## 2020-12-21 ENCOUNTER — Other Ambulatory Visit: Payer: Self-pay

## 2020-12-21 DIAGNOSIS — E559 Vitamin D deficiency, unspecified: Secondary | ICD-10-CM

## 2020-12-21 DIAGNOSIS — M8580 Other specified disorders of bone density and structure, unspecified site: Secondary | ICD-10-CM

## 2020-12-31 ENCOUNTER — Other Ambulatory Visit: Payer: Self-pay | Admitting: Medical

## 2021-01-04 ENCOUNTER — Encounter: Payer: Self-pay | Admitting: Medical

## 2021-01-04 ENCOUNTER — Other Ambulatory Visit: Payer: Self-pay

## 2021-01-04 ENCOUNTER — Ambulatory Visit (INDEPENDENT_AMBULATORY_CARE_PROVIDER_SITE_OTHER): Payer: BC Managed Care – PPO | Admitting: Medical

## 2021-01-04 VITALS — BP 118/88 | Ht 68.0 in | Wt 171.2 lb

## 2021-01-04 DIAGNOSIS — R7989 Other specified abnormal findings of blood chemistry: Secondary | ICD-10-CM | POA: Diagnosis not present

## 2021-01-04 DIAGNOSIS — E559 Vitamin D deficiency, unspecified: Secondary | ICD-10-CM | POA: Diagnosis not present

## 2021-01-04 DIAGNOSIS — E291 Testicular hypofunction: Secondary | ICD-10-CM

## 2021-01-04 DIAGNOSIS — M810 Age-related osteoporosis without current pathological fracture: Secondary | ICD-10-CM | POA: Diagnosis not present

## 2021-01-04 DIAGNOSIS — Z23 Encounter for immunization: Secondary | ICD-10-CM

## 2021-01-04 MED ORDER — VITAMIN D 50 MCG (2000 UT) PO CAPS: 1.0000 | ORAL_CAPSULE | Freq: Every day | ORAL | 3 refills | Status: DC

## 2021-01-04 MED ORDER — ALENDRONATE SODIUM 70 MG PO TABS: 70.0000 mg | ORAL_TABLET | ORAL | 2 refills | Status: DC

## 2021-01-04 MED ORDER — TESTOSTERONE 20.25 MG/1.25GM (1.62%) TD GEL: 2.0000 | Freq: Every day | TRANSDERMAL | 2 refills | Status: DC

## 2021-01-04 NOTE — Addendum Note (Signed)
Addended by: Sheilah Pigeon A on: 01/04/2021 01:10 PM   Modules accepted: Orders
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
## 2021-01-05 ENCOUNTER — Other Ambulatory Visit: Payer: Self-pay

## 2021-01-06 MED ORDER — METHYLPHENIDATE HCL ER (LA) 30 MG PO CP24: 30.0000 mg | ORAL_CAPSULE | Freq: Every day | ORAL | 0 refills | Status: DC

## 2021-01-08 ENCOUNTER — Telehealth: Payer: Self-pay

## 2021-01-08 NOTE — Telephone Encounter (Signed)
P.A. TESTOSTERONE GEL now approved, sent pt mychart message

## 2021-01-09 ENCOUNTER — Encounter: Payer: Self-pay | Admitting: Gastroenterology

## 2021-01-12 ENCOUNTER — Other Ambulatory Visit: Payer: Self-pay | Admitting: Gastroenterology

## 2021-01-12 DIAGNOSIS — K222 Esophageal obstruction: Secondary | ICD-10-CM

## 2021-01-12 DIAGNOSIS — R933 Abnormal findings on diagnostic imaging of other parts of digestive tract: Secondary | ICD-10-CM

## 2021-01-12 DIAGNOSIS — R1319 Other dysphagia: Secondary | ICD-10-CM

## 2021-01-13 ENCOUNTER — Ambulatory Visit (INDEPENDENT_AMBULATORY_CARE_PROVIDER_SITE_OTHER): Payer: BC Managed Care – PPO | Admitting: Gastroenterology

## 2021-01-13 ENCOUNTER — Encounter: Payer: Self-pay | Admitting: Gastroenterology

## 2021-01-13 VITALS — BP 126/84 | HR 66 | Ht 68.0 in | Wt 170.0 lb

## 2021-01-13 DIAGNOSIS — R1319 Other dysphagia: Secondary | ICD-10-CM | POA: Diagnosis not present

## 2021-01-13 NOTE — Patient Instructions (Addendum)
If you are age 59 or younger, your body mass index should be between 19-25. Your Body mass index is 25.85 kg/m. If this is out of the aformentioned range listed, please consider follow up with your Primary Care Provider.  __________________________________________________________  The [facility] providers would like to encourage you to use [facility] to communicate with providers for non-urgent requests or questions.  Due to long hold times on the telephone, sending your provider a message by [facility] may be a faster and more efficient way to get a response.  Please allow 48 business hours for a response.  Please remember that this is for non-urgent requests.   - Avoid all NSAIDs.  - Continue Vitamin D supplements  - Continue Pantoprazole 40 mg twice daily for suspected reflux, consider slow taper of pantoprazole 40 mg daily  - Take small bites, chew thoroughly, eat slowly, and drink a sip of water between bites  - Repeat EGD with dilation with recurrent symptoms  - Colonoscopy in 7 years  Thank you for entrusting me with your care and choosing [facility].  Dr Ardis Hughs
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
## 2021-01-14 NOTE — Telephone Encounter (Signed)
Plan exclusion

## 2021-01-17 NOTE — Progress Notes (Signed)
Referred to [facility]

## 2021-02-06 ENCOUNTER — Other Ambulatory Visit: Payer: Self-pay

## 2021-02-07 MED ORDER — METHYLPHENIDATE HCL ER (LA) 30 MG PO CP24
30.0000 mg | ORAL_CAPSULE | Freq: Every day | ORAL | 0 refills | Status: DC
Start: 2021-02-07 — End: 2021-03-10

## 2021-02-09 ENCOUNTER — Telehealth: Payer: Self-pay | Admitting: Medical

## 2021-02-09 NOTE — Telephone Encounter (Signed)
Left message for pt to return call concerning Prolia. I need to speak to pt.   Prolia and prior Josem Kaufmann has been approved.

## 2021-02-17 ENCOUNTER — Other Ambulatory Visit: Payer: Self-pay

## 2021-02-17 ENCOUNTER — Encounter: Payer: Self-pay | Admitting: Family Medicine

## 2021-02-17 ENCOUNTER — Telehealth: Payer: Self-pay | Admitting: Medical

## 2021-02-17 ENCOUNTER — Telehealth (INDEPENDENT_AMBULATORY_CARE_PROVIDER_SITE_OTHER): Payer: BC Managed Care – PPO | Admitting: Family Medicine

## 2021-02-17 VITALS — BP 138/79 | HR 91 | Temp 98.2°F | Ht 68.0 in | Wt 170.0 lb

## 2021-02-17 DIAGNOSIS — R5383 Other fatigue: Secondary | ICD-10-CM

## 2021-02-17 DIAGNOSIS — R059 Cough, unspecified: Secondary | ICD-10-CM

## 2021-02-17 NOTE — Progress Notes (Signed)
Start time:3:18 End time: 3:34  Virtual Visit via Video Note  I connected with Jesse Hardin on 02/17/21 by a video enabled telemedicine application and verified that I am speaking with the correct person using two identifiers.  Location: Patient: home Provider: office   I discussed the limitations of evaluation and management by telemedicine and the availability of in person appointments. The patient expressed understanding and agreed to proceed.  History of Present Illness:  Chief Complaint  Patient presents with   Facial Pain    VIRTUAL sinus pain and pressure that began Sunday. Coughs sometimes from PND, very occasional. Might also be allergies he thinks. Bad HA all week. What felt like a migraine 2 nights ago. HA has been constant. No chills, body aches or fever. At night he has woken up with some sweats. Home covid test this am was negative.    11/13 started with fatigue, feeling "blah".  The next day he woke up with a headache, pressure all around, mostly at front of head.  Wasn't bad enough to take anything.  HA has been constant since then, fluctuates in severity, mostly a pressure currently. He has h/o allergies, and has slight cough in the mornings. He is coughing a little more now. Cough is nonproductive. Not blowing nose.  Denies any discolored drainage. Woke up at 4am with some chills/sweats the last couple of nights.  Husband has felt similarly (blah). Had friends over 11/13, no known sick contacts. No travel.  COVID test negative at home today.  COVID vaccines--J&J  1, 1 Pfizer booster 01/2020, and bivalent booster 01/2021.   PMH, PSH, SH reviewed  Outpatient Encounter Medications as of 02/17/2021  Medication Sig Note   alendronate (FOSAMAX) 70 MG tablet Take 1 tablet (70 mg total) by mouth every 7 (seven) days. Take with a full glass of water on an empty stomach.    buPROPion (WELLBUTRIN XL) 300 MG 24 hr tablet Take 1 tablet (300 mg total) by mouth daily.     Cholecalciferol (VITAMIN D) 50 MCG (2000 UT) CAPS Take 1 capsule (2,000 Units total) by mouth daily.    methylphenidate (RITALIN LA) 30 MG 24 hr capsule Take 1 capsule (30 mg total) by mouth daily.    pantoprazole (PROTONIX) 40 MG tablet TAKE 1 TABLET BY MOUTH TWICE A DAY    rosuvastatin (CRESTOR) 10 MG tablet TAKE 1 TABLET BY MOUTH EVERY DAY    Testosterone 20.25 MG/1.25GM (1.62%) GEL Place 2 Squirts onto the skin daily.    fexofenadine (ALLEGRA) 60 MG tablet Take 60 mg by mouth daily as needed. (Patient not taking: Reported on 02/17/2021) 02/17/2021: Takes only as needed   [DISCONTINUED] Testosterone 20.25 MG/ACT (1.62%) GEL SMARTSIG:2 Pump Topical Daily    No facility-administered encounter medications on file as of 02/17/2021.   Takes allegra only in the spring  No Known Allergies  ROS:  headache, chills, fatigue per HPI.  Mild allergies--PND, slight cough. No sneezing, runny nose. No  n/v/d, CP or SOB, bruising, rashes Reflux is controlled     Observations/Objective:  BP 138/79   Pulse 91   Temp 98.2 F (36.8 C) (Temporal)   Ht 5\' 8"  (1.727 m)   Wt 170 lb (77.1 kg)   BMI 25.85 kg/m   Pleasant, well-appearing male in no distress. He is alert and oriented. Cranial nerves are grossly intact Occasional throat-clearing and cough during visit.  No sniffling/sneezing. Exam is limited due to virtual nature of the visit.  Assessment and Plan:  Fatigue,  unspecified type - given that husband feeling similarly, poss viral illness. Not c/w flu, COVID neg. Poss allergies contrib. Cont supportive measures, f/u if worse  Cough, unspecified type - very mild, poss PND contrib. Discussed antihistamine, mucinex. f/u if persists/worsens  Stay well hydrated. Take the allegra to see if that helps with the possible sinus pressure and postnasal drainage. Take Mucinex or Mucinex DM (12 hour tablets, take twice daily). If headaches are more sinus pressure, you can try sinus rinses or a  decongestant (sudafed).  If you develop fever, discolored mucus or phlegm, worsening cough, chest pain, pain with breathing, shortness of breath, or other new symptoms, please reach out for re-evaluation.  Follow Up Instructions:    I discussed the assessment and treatment plan with the patient. The patient was provided an opportunity to ask questions and all were answered. The patient agreed with the plan and demonstrated an understanding of the instructions.   The patient was advised to call back or seek an in-person evaluation if the symptoms worsen or if the condition fails to improve as anticipated.  I spent 20 minutes dedicated to the care of this patient, including pre-visit review of records, face to face time, post-visit ordering of testing and documentation.    Vikki Ports, MD

## 2021-02-17 NOTE — Telephone Encounter (Signed)
Pt was called concerning prolia. Pt states he does want to move forward with injection. Appt was scheduled. Pt states that he called his insurance company 02/16/2021 and verified that he has met his out of pocket max for this year. He states he will not have to pay for this injection. He states will will discuss payment next year when next injection is due.

## 2021-02-17 NOTE — Patient Instructions (Signed)
  Stay well hydrated. Take the allegra to see if that helps with the possible sinus pressure and postnasal drainage. Take Mucinex or Mucinex DM (12 hour tablets, take twice daily). If headaches are more sinus pressure, you can try sinus rinses or a decongestant (sudafed).  If you develop fever, discolored mucus or phlegm, worsening cough, chest pain, pain with breathing, shortness of breath, or other new symptoms, please reach out for re-evaluation.

## 2021-02-22 ENCOUNTER — Other Ambulatory Visit: Payer: BC Managed Care – PPO

## 2021-02-22 ENCOUNTER — Other Ambulatory Visit: Payer: Self-pay

## 2021-02-22 NOTE — Progress Notes (Signed)
Patient come in today for Prolia injection, upon medication reconciliation patient reports he last took Alendronate on Sunday ( 2 days ago) was not informed to stop prior to injection.   Discussed with Audelia Acton and patient needs to stop alendronate 1 week prior to injection.  Patient ok with this and rescheduled nurse visit for next week, medication placed back in fridge

## 2021-03-01 ENCOUNTER — Other Ambulatory Visit: Payer: Self-pay

## 2021-03-01 ENCOUNTER — Other Ambulatory Visit (INDEPENDENT_AMBULATORY_CARE_PROVIDER_SITE_OTHER): Payer: BC Managed Care – PPO

## 2021-03-01 DIAGNOSIS — M810 Age-related osteoporosis without current pathological fracture: Secondary | ICD-10-CM | POA: Diagnosis not present

## 2021-03-01 MED ORDER — DENOSUMAB 60 MG/ML ~~LOC~~ SOSY
60.0000 mg | PREFILLED_SYRINGE | Freq: Once | SUBCUTANEOUS | Status: AC
Start: 2021-03-01 — End: 2021-03-01
  Administered 2021-03-01: 60 mg via SUBCUTANEOUS

## 2021-03-10 ENCOUNTER — Telehealth: Payer: Self-pay

## 2021-03-10 ENCOUNTER — Other Ambulatory Visit: Payer: Self-pay | Admitting: Medical

## 2021-03-10 MED ORDER — METHYLPHENIDATE HCL ER (LA) 30 MG PO CP24: 30.0000 mg | ORAL_CAPSULE | Freq: Every day | ORAL | 0 refills | Status: DC

## 2021-03-10 NOTE — Telephone Encounter (Signed)
Pt left message needs refill Ritalin, said sent mychart refill request 12/4 and then mychart request yesterday and still hasn't heard anything

## 2021-03-18 NOTE — Telephone Encounter (Signed)
done

## 2021-04-02 ENCOUNTER — Other Ambulatory Visit: Payer: Self-pay | Admitting: Medical

## 2021-04-13 ENCOUNTER — Other Ambulatory Visit: Payer: Self-pay | Admitting: Medical

## 2021-04-13 MED ORDER — METHYLPHENIDATE HCL ER (LA) 30 MG PO CP24: 30.0000 mg | ORAL_CAPSULE | Freq: Every day | ORAL | 0 refills | Status: DC

## 2021-04-15 ENCOUNTER — Other Ambulatory Visit: Payer: Self-pay | Admitting: Gastroenterology

## 2021-04-15 DIAGNOSIS — R933 Abnormal findings on diagnostic imaging of other parts of digestive tract: Secondary | ICD-10-CM

## 2021-04-15 DIAGNOSIS — K222 Esophageal obstruction: Secondary | ICD-10-CM

## 2021-04-15 DIAGNOSIS — R1319 Other dysphagia: Secondary | ICD-10-CM

## 2021-05-12 ENCOUNTER — Telehealth: Payer: Self-pay | Admitting: Medical

## 2021-05-12 ENCOUNTER — Other Ambulatory Visit: Payer: Self-pay | Admitting: Medical

## 2021-05-12 MED ORDER — METHYLPHENIDATE HCL ER (LA) 30 MG PO CP24: 30.0000 mg | ORAL_CAPSULE | Freq: Every day | ORAL | 0 refills | Status: DC

## 2021-05-12 NOTE — Telephone Encounter (Signed)
I refilled the Ritalin today.  Please schedule him a med check within the next 3 weeks

## 2021-05-12 NOTE — Telephone Encounter (Signed)
Left pt a VM to call the office and schedule med check. Pt scheduled for CPE in August

## 2021-05-27 ENCOUNTER — Other Ambulatory Visit: Payer: Self-pay

## 2021-05-27 ENCOUNTER — Other Ambulatory Visit: Payer: Self-pay | Admitting: Medical

## 2021-05-27 ENCOUNTER — Ambulatory Visit: Payer: BC Managed Care – PPO | Admitting: Medical

## 2021-05-27 VITALS — BP 120/78 | HR 88 | Wt 177.8 lb

## 2021-05-27 DIAGNOSIS — E785 Hyperlipidemia, unspecified: Secondary | ICD-10-CM

## 2021-05-27 DIAGNOSIS — R7989 Other specified abnormal findings of blood chemistry: Secondary | ICD-10-CM

## 2021-05-27 DIAGNOSIS — F319 Bipolar disorder, unspecified: Secondary | ICD-10-CM

## 2021-05-27 DIAGNOSIS — R41840 Attention and concentration deficit: Secondary | ICD-10-CM

## 2021-05-27 DIAGNOSIS — E559 Vitamin D deficiency, unspecified: Secondary | ICD-10-CM

## 2021-05-27 DIAGNOSIS — M810 Age-related osteoporosis without current pathological fracture: Secondary | ICD-10-CM | POA: Diagnosis not present

## 2021-05-27 DIAGNOSIS — F411 Generalized anxiety disorder: Secondary | ICD-10-CM

## 2021-05-27 DIAGNOSIS — Z0279 Encounter for issue of other medical certificate: Secondary | ICD-10-CM

## 2021-05-27 DIAGNOSIS — I8393 Asymptomatic varicose veins of bilateral lower extremities: Secondary | ICD-10-CM | POA: Insufficient documentation
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
## 2021-05-28 ENCOUNTER — Other Ambulatory Visit: Payer: Self-pay | Admitting: Medical

## 2021-05-28 LAB — VITAMIN D 25 HYDROXY (VIT D DEFICIENCY, FRACTURES): Vit D, 25-Hydroxy: 41.7 ng/mL (ref 30.0–100.0)

## 2021-05-28 LAB — TESTOSTERONE: Testosterone: 614 ng/dL (ref 264–916)

## 2021-05-28 MED ORDER — TESTOSTERONE 20.25 MG/1.25GM (1.62%) TD GEL: 2.0000 | Freq: Every day | TRANSDERMAL | 5 refills | Status: DC

## 2021-06-03 ENCOUNTER — Encounter: Payer: Self-pay | Admitting: Physician Assistant

## 2021-06-03 ENCOUNTER — Telehealth: Payer: BC Managed Care – PPO | Admitting: Physician Assistant

## 2021-06-03 VITALS — BP 126/85 | Wt 175.0 lb

## 2021-06-03 DIAGNOSIS — R051 Acute cough: Secondary | ICD-10-CM | POA: Diagnosis not present

## 2021-06-03 DIAGNOSIS — J3089 Other allergic rhinitis: Secondary | ICD-10-CM | POA: Diagnosis not present

## 2021-06-03 MED ORDER — BENZONATATE 100 MG PO CAPS: 200.0000 mg | ORAL_CAPSULE | Freq: Three times a day (TID) | ORAL | 0 refills | Status: DC | PRN

## 2021-06-03 NOTE — Progress Notes (Signed)
Start time: 2:32 pm ?End time: 2:55 pm ? ?Virtual Visit via Video Note ? ? Patient ID: Jesse Hardin, male    DOB: October 10, 1960, 61 y.o.   MRN: [id number] ? ?I connected with above patient on 06/03/21 by a video enabled telemedicine application and verified that I am speaking with the correct person using two identifiers. ? ?Location: ?Patient: home ?Provider: office ?  ?I discussed the limitations of evaluation and management by telemedicine and the availability of in person appointments. The patient expressed understanding and agreed to proceed. ? ?History of Present Illness: ? ?Chief Complaint  ?Patient presents with  ? Cough  ?  Virtual- Cough and congestion, negative Covid test today and yesterday.  Bad Sinus pressure.  ? ?Reports that last week he had a sore throat that is now better; has congestion with clear nasal discharge; post-nasal drip; has taken  ?OTC Nyquil, Sudafed, Mucinex DM which are somewhat helpful; hasn't taken Allegra and states when he goes outside he starts coughing ;  Denies fever / chills / nausea / vomiting / diarrhea / + constipation, and has OTC medicine to help that; + facial pressure, but no pain; no headache; has a history of sinusitis and pneumonia and is concerned he may be getting that; no recent travel; no sick contacts; negative COVID tests at home. ? ?  ?Observations/Objective: ? ?BP 126/85   Wt 175 lb (79.4 kg)   BMI 26.61 kg/m?  ? ? ?Assessment: ?Encounter Diagnoses  ?Name Primary?  ? Non-seasonal allergic rhinitis, unspecified trigger Yes  ? Acute cough   ? ? ? ?Plan: ? ?Rest, increase clear fluids, OTC Tylenol (acetamenophen) for body aches, headaches, fever, chills as needed. ? ?Jesse Hardin was seen today for cough. ? ?Diagnoses and all orders for this visit: ? ?Non-seasonal allergic rhinitis, unspecified trigger ? ?Acute cough ?-     benzonatate (TESSALON) 100 MG capsule; Take 2 capsules (200 mg total) by mouth 3 (three) times daily as needed for cough. ? ? ? ?Follow up:   Continue OTC cold medicine, add nasal steroids, antihistamine, nasal saline rinse; patient reassured that without headache or fever, sinusitis and pneumonia aren't as likely;  If your symptoms get worse, please go to Urgent Care or go to the Emergency Department for further evaluation in person.  ? ?I discussed the assessment and treatment plan with the patient. The patient was provided an opportunity to ask questions and all were answered. The patient agreed with the plan and demonstrated an understanding of the instructions. ?  ?The patient was advised to call back or seek an in-person evaluation if the symptoms worsen or if the condition fails to improve as anticipated. ? ?I spent  minutes dedicated to the care of this patient, including pre-visit review of records, face to face time, post-visit ordering of testing and documentation. ? ? ? ?Irene Pap, PA-C ?

## 2021-06-03 NOTE — Patient Instructions (Addendum)
You can take an over the counter antihistamine to help with allergic rhinitis / itching / hives: NON-DROWSY Allegra (Fexofenadine) 180 mg daily or NON-Drowsy Claritin (Loratidine) 10 mg daily  or DROWSY Benadryl (Diphenhydramine) 25 mg as directed or Zyrtec (Cetirizine) 10 mg daily. You can go to a store with a pharmacy and ask them to help you find these medicines. ? ?For nasal congestion and post nasal drip you can use an OTC nasal saline rinse as well as one of the OTC nasal steroids (generic equivalent) like Flonase (Fluticasone) or Rhinocort (Budesonide) or Nasacort (Triamcinolone) or Nasonex (Mometasone Furoate).  ? ?You can take an OTC decongestant like Sudafed or phenylephrine to help dry up nasal congestion. Do Not take this medicine if you have high blood pressure or heart palpitations. You can go to a store with a pharmacy and ask them to help you find these medicines. ? ?You can take an OTC expectorant like guaifenesin or Mucinex to help decrease head and nasal congestion.  ? ?You can take OTC cough suppressant as needed like dextromethorphan (DM) in any OTC cough medicine that has the abbreviation DM. ? ?Maintain hydration by drinking small amounts of clear fluids frequently (water or hot tea with lemon) ?

## 2021-06-14 ENCOUNTER — Telehealth: Payer: Self-pay | Admitting: Medical

## 2021-06-14 ENCOUNTER — Other Ambulatory Visit: Payer: Self-pay | Admitting: Medical

## 2021-06-14 MED ORDER — METHYLPHENIDATE HCL ER (LA) 30 MG PO CP24: 30.0000 mg | ORAL_CAPSULE | Freq: Every day | ORAL | 0 refills | Status: DC

## 2021-06-14 NOTE — Telephone Encounter (Signed)
Pt called and would like a refill on his ritalin please send to the [id number]- [place], [place] - [address]

## 2021-07-01 ENCOUNTER — Other Ambulatory Visit: Payer: Self-pay | Admitting: Gastroenterology

## 2021-07-01 ENCOUNTER — Other Ambulatory Visit: Payer: Self-pay | Admitting: Medical

## 2021-07-01 DIAGNOSIS — R933 Abnormal findings on diagnostic imaging of other parts of digestive tract: Secondary | ICD-10-CM

## 2021-07-01 DIAGNOSIS — K222 Esophageal obstruction: Secondary | ICD-10-CM

## 2021-07-01 DIAGNOSIS — R1319 Other dysphagia: Secondary | ICD-10-CM

## 2021-07-12 ENCOUNTER — Telehealth: Payer: Self-pay | Admitting: Medical

## 2021-07-12 MED ORDER — METHYLPHENIDATE HCL ER (LA) 30 MG PO CP24: 30.0000 mg | ORAL_CAPSULE | Freq: Every day | ORAL | 0 refills | Status: DC

## 2021-07-12 NOTE — Telephone Encounter (Signed)
Pt called and is requesting a refill on his ritalin please send to the [id number]- [place] [address]?

## 2021-07-14 ENCOUNTER — Telehealth: Payer: Self-pay

## 2021-07-14 NOTE — Telephone Encounter (Signed)
Faxed to pharmacy

## 2021-07-14 NOTE — Telephone Encounter (Signed)
Pt. Called stating you filled his ritalin but not to be filled until tomorrow 07/15/21 and he needs it filled today because he is going out of town today. He spoke with the pharmacy they told him if someone from her called them and gave permission to fill it early today.  ?

## 2021-07-14 NOTE — Telephone Encounter (Signed)
P.A Methylphenidate HCL ER Capsules approved 06/14/2021-07/14/2022. I will notify patient it was approved ?

## 2021-07-27 ENCOUNTER — Other Ambulatory Visit: Payer: Self-pay | Admitting: Gastroenterology

## 2021-07-27 DIAGNOSIS — K222 Esophageal obstruction: Secondary | ICD-10-CM

## 2021-07-27 DIAGNOSIS — R1319 Other dysphagia: Secondary | ICD-10-CM

## 2021-07-27 DIAGNOSIS — R933 Abnormal findings on diagnostic imaging of other parts of digestive tract: Secondary | ICD-10-CM

## 2021-08-12 ENCOUNTER — Ambulatory Visit
Admission: RE | Admit: 2021-08-12 | Discharge: 2021-08-12 | Disposition: A | Discharge status: Home / Self Care | Payer: BC Managed Care – PPO | Source: Ambulatory Visit | Attending: Medical | Admitting: Medical

## 2021-08-12 ENCOUNTER — Telehealth: Payer: BC Managed Care – PPO | Admitting: Medical

## 2021-08-12 ENCOUNTER — Encounter: Payer: Self-pay | Admitting: Medical

## 2021-08-12 VITALS — Wt 174.0 lb

## 2021-08-12 DIAGNOSIS — R053 Chronic cough: Secondary | ICD-10-CM

## 2021-08-12 MED ORDER — HYDROCODONE BIT-HOMATROP MBR 5-1.5 MG/5ML PO SOLN: 5.0000 mL | Freq: Three times a day (TID) | ORAL | 0 refills | Status: AC | PRN

## 2021-08-12 MED ORDER — BENZONATATE 200 MG PO CAPS: 200.0000 mg | ORAL_CAPSULE | Freq: Three times a day (TID) | ORAL | 0 refills | Status: DC | PRN

## 2021-08-12 NOTE — Progress Notes (Signed)
?Subjective:  ?  ? Patient ID: Jesse Hardin, male   DOB: 05/30/1960, 61 y.o.   MRN: [id number] ? ?This visit type was conducted due to national recommendations for restrictions regarding the COVID-19 Pandemic (e.g. social distancing) in an effort to limit this patient's exposure and mitigate transmission in our community.  Due to their co-morbid illnesses, this patient is at least at moderate risk for complications without adequate follow up.  This format is felt to be most appropriate for this patient at this time.   ? ?Documentation for virtual audio and video telecommunications through [place] encounter: ? ?The patient was located at home. ?The provider was located in the office. ?The patient did consent to this visit and is aware of possible charges through their insurance for this visit. ? ?The other persons participating in this telemedicine service were none. ?Time spent on call was 20 minutes and in review of previous records 20 minutes total. ? ?This virtual service is not related to other E/M service within previous 7 days. ? ? ?HPI ?Chief Complaint  ?Patient presents with  ? coughing spells  ?  Severe coughing spells that causes dizziness, white spots, can't stand and then causes HA.  Started a couple months ago but has gotten worse recently.   ? ?He notes few month history of coughing.  ? ?Having coughing spells, sometimes 3-5 times daily, bad coughing spells out of no where.  Having to sit down due to extreme coughing spells.  After 10-15 minutes it resolves.   ? ?At first thought it was allergies.   Had been using allegra and zyrtec but that seemed to be helping.  Still using antihistamine but not helping now. ? ?Had used some leftover tessalon Per[place].  Now using mucinex cold and flu.  ? ?No current GERD issues.   Using protonix at night.  Was on this BID at first, per GI, Dr. Tarri Glenn. ? ?Nonsmoker. ? ?Not around dust or fumes.   ? ?Has 2 nonallergenic dogs.  Has a cat, but not around the cat  much. ? ?Past Medical History:  ?Diagnosis Date  ? Allergy   ? Anxiety   ? Bipolar disorder ([place])   ? "quick cycler", hx/o manic episodes if off medication  ? Colon polyps 06/26/2011  ? Ascending colon  ? Depression   ? Diverticulitis of colon 06/26/2011  ? per colonoscopy  ? GERD (gastroesophageal reflux disease)   ? Grade I internal hemorrhoids 06/26/2011  ? colonscopy.   ? HLD (hyperlipidemia)   ? Insomnia   ? Migraine   ? Osteoporosis   ? Pneumonia   ? Sexual dysfunction   ? ?Current Outpatient Medications on File Prior to Visit  ?Medication Sig Dispense Refill  ? benzonatate (TESSALON) 100 MG capsule Take 2 capsules (200 mg total) by mouth 3 (three) times daily as needed for cough. 20 capsule 0  ? buPROPion (WELLBUTRIN XL) 300 MG 24 hr tablet TAKE 1 TABLET BY MOUTH EVERY DAY 90 tablet 1  ? cetirizine (ZYRTEC) 10 MG tablet Take 10 mg by mouth daily.    ? Cholecalciferol (VITAMIN D) 50 MCG (2000 UT) CAPS Take 1 capsule (2,000 Units total) by mouth daily. 90 capsule 3  ? fexofenadine (ALLEGRA) 60 MG tablet Take 60 mg by mouth daily as needed.    ? methylphenidate (RITALIN LA) 30 MG 24 hr capsule Take 1 capsule (30 mg total) by mouth daily. 30 capsule 0  ? pantoprazole (PROTONIX) 40 MG tablet TAKE 1 TABLET BY MOUTH  TWICE A DAY 60 tablet 6  ? rosuvastatin (CRESTOR) 10 MG tablet TAKE 1 TABLET BY MOUTH EVERY DAY 90 tablet 1  ? Testosterone 20.25 MG/1.25GM (1.62%) GEL Place 2 Squirts onto the skin daily. 1.25 g 5  ? ?No current facility-administered medications on file prior to visit.  ? ? ? ?Review of Systems ?As in subjective ?   ?Objective:  ? Physical Exam ?Due to coronavirus pandemic stay at home measures, patient visit was virtual and they were not examined in person.   ?Wt 174 lb (78.9 kg)   BMI 26.46 kg/m?  ? ?Gen: wd, wn, nad, well-appearing ?During the visit no obvious wheezing or dyspnea or cough fits ? ? ?   ?Assessment:  ?   ?Encounter Diagnosis  ?Name Primary?  ? Chronic cough Yes  ? ? ?   ?Plan:  ?    ?We discussed symptoms, concerns, possible causes of chronic cough or cough fits.  No history of asthma or smoking.  He will change his Protonix to morning time 30 minutes before breakfast.  He would just use 1 antihistamine at night instead of doubling up.  Avoid allergen triggers.  Avoid acid reflux triggers.  Go for chest x-ray.  Hydrate well. ? ?Can use Hycodan for worse cough, Tessalon for moderate cough. ? ?There are no diagnoses linked to this encounter. ? ?F/u pending chest xray ? ?

## 2021-08-15 ENCOUNTER — Telehealth: Payer: Self-pay | Admitting: Medical

## 2021-08-15 ENCOUNTER — Other Ambulatory Visit: Payer: Self-pay | Admitting: Medical

## 2021-08-15 MED ORDER — METHYLPHENIDATE HCL ER (LA) 30 MG PO CP24: 30.0000 mg | ORAL_CAPSULE | Freq: Every day | ORAL | 0 refills | Status: DC

## 2021-08-15 MED ORDER — METHYLPHENIDATE HCL ER (LA) 30 MG PO CP24
30.0000 mg | ORAL_CAPSULE | Freq: Every day | ORAL | 0 refills | Status: DC
Start: 2021-10-16 — End: 2021-11-28

## 2021-08-15 NOTE — Telephone Encounter (Signed)
Pt called and is requesting a refill on his ritalin [id number] [address]?

## 2021-09-01 ENCOUNTER — Telehealth: Payer: Self-pay | Admitting: Medical

## 2021-09-01 NOTE — Telephone Encounter (Signed)
Pt called concerning prolia. Pt advised approved with insurance and is out of pocket expense is estimated at $302. Pt scheduled form 09/06/21 and advised to call ahead so injection can be set out.

## 2021-09-06 ENCOUNTER — Other Ambulatory Visit (INDEPENDENT_AMBULATORY_CARE_PROVIDER_SITE_OTHER): Payer: BC Managed Care – PPO

## 2021-09-06 DIAGNOSIS — M810 Age-related osteoporosis without current pathological fracture: Secondary | ICD-10-CM | POA: Diagnosis not present

## 2021-09-06 MED ORDER — DENOSUMAB 60 MG/ML ~~LOC~~ SOSY
60.0000 mg | PREFILLED_SYRINGE | Freq: Once | SUBCUTANEOUS | Status: AC
  Administered 2021-09-06: 60 mg via SUBCUTANEOUS

## 2021-09-22 ENCOUNTER — Other Ambulatory Visit: Payer: Self-pay | Admitting: Medical

## 2021-09-22 ENCOUNTER — Telehealth: Payer: Self-pay | Admitting: Medical

## 2021-09-22 NOTE — Telephone Encounter (Signed)
Pt called and requested refills on Ritalin LA. [facility] [address].

## 2021-09-23 ENCOUNTER — Ambulatory Visit: Payer: BC Managed Care – PPO | Admitting: Medical

## 2021-09-23 VITALS — BP 120/80 | HR 94 | Temp 98.7°F | Wt 180.0 lb

## 2021-09-23 DIAGNOSIS — Z9889 Other specified postprocedural states: Secondary | ICD-10-CM | POA: Diagnosis not present

## 2021-09-23 DIAGNOSIS — R053 Chronic cough: Secondary | ICD-10-CM | POA: Diagnosis not present

## 2021-09-23 DIAGNOSIS — K449 Diaphragmatic hernia without obstruction or gangrene: Secondary | ICD-10-CM

## 2021-09-23 DIAGNOSIS — K145 Plicated tongue: Secondary | ICD-10-CM

## 2021-09-23 MED ORDER — HYDROCOD POLI-CHLORPHE POLI ER 10-8 MG/5ML PO SUER: 5.0000 mL | Freq: Two times a day (BID) | ORAL | 0 refills | Status: DC

## 2021-09-23 MED ORDER — B COMPLEX VITAMINS PO CAPS: 1.0000 | ORAL_CAPSULE | Freq: Every day | ORAL | 1 refills | Status: AC

## 2021-09-23 MED ORDER — B COMPLEX VITAMINS PO CAPS: 1.0000 | ORAL_CAPSULE | Freq: Every day | ORAL | 1 refills | Status: DC

## 2021-10-07 ENCOUNTER — Telehealth: Payer: Self-pay | Admitting: Medical

## 2021-10-07 MED ORDER — HYDROCOD POLI-CHLORPHE POLI ER 10-8 MG/5ML PO SUER: 5.0000 mL | Freq: Two times a day (BID) | ORAL | 0 refills | Status: DC

## 2021-10-07 NOTE — Telephone Encounter (Signed)
Pt is out of chlorpheniramine, states it worked well and would like a refill. I was advised to ask you since Audelia Acton is out of the office.

## 2021-10-07 NOTE — Telephone Encounter (Signed)
Pt. Wanted me to forward message to you to see if you could refill his cough syrup for him. He didn't feel like he needed to f/u with you again since you had referred him to Pulmonary already. He did stat his cough is still pretty bad everyday and wanted to know if he could get a refill on the cough syrup because that does help some.

## 2021-10-18 ENCOUNTER — Ambulatory Visit: Payer: BC Managed Care – PPO | Admitting: Pulmonary Disease

## 2021-10-18 ENCOUNTER — Encounter: Payer: Self-pay | Admitting: Pulmonary Disease

## 2021-10-18 VITALS — BP 124/72 | HR 69 | Temp 97.6°F | Ht 68.5 in | Wt 178.6 lb

## 2021-10-18 DIAGNOSIS — R053 Chronic cough: Secondary | ICD-10-CM

## 2021-10-18 MED ORDER — FLUTICASONE FUROATE-VILANTEROL 200-25 MCG/ACT IN AEPB: 1.0000 | INHALATION_SPRAY | Freq: Every day | RESPIRATORY_TRACT | 2 refills | Status: DC

## 2021-10-18 MED ORDER — HYDROCOD POLI-CHLORPHE POLI ER 10-8 MG/5ML PO SUER: 5.0000 mL | Freq: Every evening | ORAL | 0 refills | Status: DC | PRN

## 2021-10-18 NOTE — Patient Instructions (Addendum)
Chronic cough --START Breo 200 ONE puff ONCE a day --CONTINUE cough syrup nightly. Stop after using inhaler two weeks --ORDER methacholine challenge when next available. Please hold Breo three days before this test.  --CONTINUE protonix 40 mg twice daily  Follow-up with me in 10 weeks

## 2021-10-18 NOTE — Progress Notes (Signed)
Subjective:   PATIENT ID: Jesse Hardin GENDER: male DOB: [birth year], MRN: [id number]   HPI  Chief Complaint  Patient presents with   Consult    Cough for month. Lenard Forth)     Reason for Visit: New consult for chronic cough  Jesse Hardin is a 61 year old male with GERD, hiatal hernia, s/p esophageal dilation x 2 in 2022 who presents as new consult for chronic cough.  He reports allergies for month that will trigger his cough however this year he has had a cough for 6 months with no trigger that has persisted. He will have nonproductive coughing spells that are severe and can last 10-15 min. Reports wheezing 1-2 x week during the daytime. Has coughing that will awaken him. Treated with Tussionex with improvement in symptoms and has allowed him to sleep. Allegra and zyrtec was not effective. Denies nasal congestion. On protonix 40 mg twice daily  Social History: Never smoker  I have personally reviewed patient's past medical/family/social history, allergies, current medications.  Past Medical History:  Diagnosis Date   Allergy    Anxiety    Bipolar disorder ([place])    "quick cycler", hx/o manic episodes if off medication   Colon polyps 06/26/2011   Ascending colon   Depression    Diverticulitis of colon 06/26/2011   per colonoscopy   GERD (gastroesophageal reflux disease)    Grade I internal hemorrhoids 06/26/2011   colonscopy.    HLD (hyperlipidemia)    Insomnia    Migraine    Osteoporosis    Pneumonia    Sexual dysfunction      Family History  Problem Relation Age of Onset   Depression Mother    Heart disease Father 85       CABG   Depression Sister    Depression Sister    Cancer Sister        breast lesions   Cancer Paternal Grandmother        type unknown, ? lung, heavy smoker   Stroke Neg Hx    Diabetes Neg Hx    Colon cancer Neg Hx    Rectal cancer Neg Hx    Stomach cancer Neg Hx      Social History   Occupational History    Occupation: Armed forces training and education officer  Tobacco Use   Smoking status: Never   Smokeless tobacco: Never  Vaping Use   Vaping Use: Never used  Substance and Sexual Activity   Alcohol use: Yes    Alcohol/week: 0.0 standard drinks of alcohol    Comment: 1-2 per year   Drug use: No   Sexual activity: Not on file    No Known Allergies   Outpatient Medications Prior to Visit  Medication Sig Dispense Refill   b complex vitamins capsule Take 1 capsule by mouth daily. 30 capsule 1   buPROPion (WELLBUTRIN XL) 300 MG 24 hr tablet TAKE 1 TABLET BY MOUTH EVERY DAY 90 tablet 1   chlorpheniramine-HYDROcodone (TUSSIONEX PENNKINETIC ER) 10-8 MG/5ML Take 5 mLs by mouth 2 (two) times daily. 140 mL 0   Cholecalciferol (VITAMIN D) 50 MCG (2000 UT) CAPS Take 1 capsule (2,000 Units total) by mouth daily. 90 capsule 3   methylphenidate (RITALIN LA) 30 MG 24 hr capsule Take 1 capsule (30 mg total) by mouth daily. 30 capsule 0   pantoprazole (PROTONIX) 40 MG tablet TAKE 1 TABLET BY MOUTH TWICE A DAY 60 tablet 6   rosuvastatin (CRESTOR) 10 MG tablet TAKE 1  TABLET BY MOUTH EVERY DAY 90 tablet 1   Testosterone 20.25 MG/1.25GM (1.62%) GEL Place 2 Squirts onto the skin daily. 1.25 g 5   fexofenadine (ALLEGRA) 60 MG tablet Take 60 mg by mouth daily as needed. (Patient not taking: Reported on 10/18/2021)     methylphenidate (RITALIN LA) 30 MG 24 hr capsule Take 1 capsule (30 mg total) by mouth daily. 30 capsule 0   methylphenidate (RITALIN LA) 30 MG 24 hr capsule Take 1 capsule (30 mg total) by mouth daily. 30 capsule 0   No facility-administered medications prior to visit.    Review of Systems  Constitutional:  Negative for chills, diaphoresis, fever, malaise/fatigue and weight loss.  HENT:  Negative for congestion.   Respiratory:  Positive for cough and wheezing. Negative for hemoptysis, sputum production and shortness of breath.   Cardiovascular:  Negative for chest pain, palpitations and leg swelling.     Objective:    Vitals:   10/18/21 1044  BP: 124/72  Pulse: 69  Temp: 97.6 F (36.4 C)  TempSrc: Oral  SpO2: 97%  Weight: 178 lb 9.6 oz (81 kg)  Height: 5' 8.5" (1.74 m)   SpO2: 97 %  Physical Exam: General: Well-appearing, no acute distress HENT: [place], AT Eyes: EOMI, no scleral icterus Respiratory: Clear to auscultation bilaterally.  No crackles, wheezing or rales Cardiovascular: RRR, -M/R/G, no JVD Extremities:-Edema,-tenderness Neuro: AAO x4, CNII-XII grossly intact Psych: Normal mood, normal affect  Data Reviewed:  Imaging: CXR 10/18/21 - Normal. No infiltrate, effusion or edema  PFT: 09/23/21 FVC 3.6 (81%) FEV1 2.7 (80%) Ratio 76   Interpretation: Normal spirometry  Labs: CBC    Component Value Date/Time   WBC 9.4 11/24/2020 1426   WBC 6.2 01/26/2017 0845   RBC 5.14 11/24/2020 1426   RBC 4.94 01/26/2017 0845   HGB 15.4 11/24/2020 1426   HCT 44.7 11/24/2020 1426   PLT 293 11/24/2020 1426   MCV 87 11/24/2020 1426   MCH 30.0 11/24/2020 1426   MCH 29.6 01/26/2017 0845   MCHC 34.5 11/24/2020 1426   MCHC 34.2 01/26/2017 0845   RDW 13.6 11/24/2020 1426   LYMPHSABS 2.4 09/03/2018 1400   EOSABS 0.1 09/03/2018 1400   BASOSABS 0.0 09/03/2018 1400   Absolute eos 09/03/18 - 100     Assessment & Plan:   Discussion: 61 year old male with GERD, hiatal hernia, s/p esophageal dilation x 2 in 2022 who presents as new consult for chronic cough. Common causes of cough were discussed including upper airway cough syndrome, reflux and undiagnosed obstructive lung disease. He previously had spirometry which was normal. Plan for methacholine challenge to rule out asthma. Will trial Breo in the interim with plan to wean off cough syrup to as needed. He is at risk for reflux with his underlying GI issues but currently well controlled on PPI BID and he denies symptoms of UACS at this time. Could consider nasal spray and antihistamine trial again in the future if pulmonary work-up  negative.  Chronic cough --START Breo 200 ONE puff ONCE a day --CONTINUE cough syrup nightly. Stop after using inhaler two weeks --ORDER methacholine challenge. Please hold Breo three days before this test.  --CONTINUE protonix 40 mg twice daily  Health Maintenance Immunization History  Administered Date(s) Administered   Influenza Inj Mdck Quad Pf 01/21/2017   Influenza Split 02/15/2009   Influenza, Seasonal, Injecte, Preservative Fre 12/01/2013, 02/16/2015   Influenza,inj,Quad PF,6+ Mos 02/19/2018, 01/04/2021   Influenza-Unspecified 12/08/2017, 01/08/2019   Janssen (J&J)  SARS-COV-2 Vaccination 06/13/2019   PFIZER(Purple Top)SARS-COV-2 Vaccination 01/26/2020   Pfizer Covid-19 Vaccine Bivalent Booster 22yr & up 01/04/2021   Tdap 04/07/2013   CT Lung Screen - never smoker  Orders Placed This Encounter  Procedures   Pulmonary function test    Standing Status:   Future    Standing Expiration Date:   10/19/2022    Order Specific Question:   Where should this test be performed?    [facility]   [facility]   Order Specific Question:   Methacholine challenge    Answer:   Yes   Meds ordered this encounter  Medications   chlorpheniramine-HYDROcodone (TUSSIONEX PENNKINETIC ER) 10-8 MG/5ML    Sig: Take 5 mLs by mouth at bedtime as needed for cough.    Dispense:  473 mL    Refill:  0   fluticasone furoate-vilanterol (BREO ELLIPTA) 200-25 MCG/ACT AEPB    Sig: Inhale 1 puff into the lungs daily.    Dispense:  60 each    Refill:  2    Return in about 10 weeks (around 12/27/2021).  I have spent a total time of 45-minutes on the day of the appointment reviewing prior documentation, coordinating care and discussing medical diagnosis and plan with the patient/family. Imaging, labs and tests included in this note have been reviewed and interpreted independently by me.  [place] MD [facility] 10/18/2021 11:02 AM  Office Number [phone]

## 2021-11-09 ENCOUNTER — Telehealth: Payer: Self-pay | Admitting: Emergency Medicine

## 2021-11-09 DIAGNOSIS — J4530 Mild persistent asthma, uncomplicated: Secondary | ICD-10-CM

## 2021-11-09 NOTE — Telephone Encounter (Signed)
Pharmacy, please advise. Pt's Breo may need a PA. Thanks.

## 2021-11-09 NOTE — Telephone Encounter (Signed)
New encounter started as pt reached out to front desk regarding testing and inhaler. See pt's message below. Reached out to pt via MyChart within telephone encounter.    PCC's, please advise on pt's methacholine challenge test. Thanks.    --------------------------------------------------------------- Hi Dr. Loanne Drilling, I have not heard from the hospital yet regarding the tests you requested. I know you said it may take a while before they call. Also was wondering if you have had any luck getting my insurance approval for the inhaler.   Thanks, TJ

## 2021-11-09 NOTE — Telephone Encounter (Signed)
I have called [facility] & left them another vm to call pt to schedule Meth Challenge.  I have also sent a staff message to [employer] in [place] to schedule.

## 2021-11-10 ENCOUNTER — Other Ambulatory Visit (HOSPITAL_COMMUNITY): Payer: Self-pay

## 2021-11-10 NOTE — Telephone Encounter (Signed)
I started prior auth for [facility] for patient, however when it comes to a diagnosis code what would you suggest I list on the prior auth request? I need that info to submit. Thank you!

## 2021-11-16 ENCOUNTER — Telehealth: Payer: Self-pay

## 2021-11-16 ENCOUNTER — Other Ambulatory Visit (HOSPITAL_COMMUNITY): Payer: Self-pay

## 2021-11-16 NOTE — Telephone Encounter (Signed)
Patient Advocate Encounter   Received notification that prior authorization for Breo Ellipta 200-25MCG/ACT aerosol powder is required.   PA submitted on 11/16/2021 Key : [id number] Status is pending

## 2021-11-17 NOTE — Telephone Encounter (Signed)
Pt scheduled for methacholine challenge test for 8/18. Please see phone note from 8/16 regarding PA for Breo.

## 2021-11-18 ENCOUNTER — Ambulatory Visit (HOSPITAL_COMMUNITY)
Admission: RE | Admit: 2021-11-18 | Discharge: 2021-11-18 | Disposition: A | Discharge status: Home / Self Care | Payer: BC Managed Care – PPO | Source: Ambulatory Visit | Attending: Pulmonary Disease | Admitting: Pulmonary Disease

## 2021-11-18 DIAGNOSIS — R053 Chronic cough: Secondary | ICD-10-CM | POA: Diagnosis present

## 2021-11-18 LAB — PULMONARY FUNCTION TEST
FEF 25-75 Post: 2.16 L/sec
FEF 25-75 Pre: 1.7 L/sec
FEF2575-%Change-Post: 27 %
FEF2575-%Pred-Post: 76 %
FEF2575-%Pred-Pre: 59 %
FEV1-%Change-Post: 4 %
FEV1-%Pred-Post: 78 %
FEV1-%Pred-Pre: 74 %
FEV1-Post: 2.68 L
FEV1-Pre: 2.56 L
FEV1FVC-%Change-Post: 0 %
FEV1FVC-%Pred-Pre: 96 %
FEV6-%Change-Post: 5 %
FEV6-%Pred-Post: 84 %
FEV6-%Pred-Pre: 80 %
FEV6-Post: 3.66 L
FEV6-Pre: 3.46 L
FEV6FVC-%Change-Post: 1 %
FEV6FVC-%Pred-Post: 104 %
FEV6FVC-%Pred-Pre: 103 %
FVC-%Change-Post: 4 %
FVC-%Pred-Post: 81 %
FVC-%Pred-Pre: 77 %
FVC-Post: 3.68 L
FVC-Pre: 3.52 L
Post FEV1/FVC ratio: 73 %
Post FEV6/FVC ratio: 99 %
Pre FEV1/FVC ratio: 73 %
Pre FEV6/FVC Ratio: 98 %

## 2021-11-18 MED ORDER — METHACHOLINE 0 MG/ML NEB SOLN
3.0000 mL | Freq: Once | RESPIRATORY_TRACT | Status: AC
  Administered 2021-11-18: 3 mL via RESPIRATORY_TRACT
  Filled 2021-11-18: qty 3

## 2021-11-18 MED ORDER — ALBUTEROL SULFATE (2.5 MG/3ML) 0.083% IN NEBU
2.5000 mg | INHALATION_SOLUTION | Freq: Once | RESPIRATORY_TRACT | Status: AC
  Administered 2021-11-18: 2.5 mg via RESPIRATORY_TRACT

## 2021-11-18 MED ORDER — METHACHOLINE 4 MG/ML NEB SOLN
3.0000 mL | Freq: Once | RESPIRATORY_TRACT | Status: AC
  Administered 2021-11-18: 12 mg via RESPIRATORY_TRACT
  Filled 2021-11-18: qty 3

## 2021-11-18 MED ORDER — METHACHOLINE 1 MG/ML NEB SOLN
3.0000 mL | Freq: Once | RESPIRATORY_TRACT | Status: AC
  Administered 2021-11-18: 3 mg via RESPIRATORY_TRACT
  Filled 2021-11-18: qty 3

## 2021-11-18 MED ORDER — METHACHOLINE 0.25 MG/ML NEB SOLN
3.0000 mL | Freq: Once | RESPIRATORY_TRACT | Status: AC
  Administered 2021-11-18: 0.75 mg via RESPIRATORY_TRACT
  Filled 2021-11-18: qty 3

## 2021-11-18 MED ORDER — METHACHOLINE 16 MG/ML NEB SOLN
3.0000 mL | Freq: Once | RESPIRATORY_TRACT | Status: AC
  Administered 2021-11-18: 48 mg via RESPIRATORY_TRACT
  Filled 2021-11-18: qty 3

## 2021-11-18 MED ORDER — METHACHOLINE 0.0625 MG/ML NEB SOLN
3.0000 mL | Freq: Once | RESPIRATORY_TRACT | Status: AC
  Administered 2021-11-18: 0.1875 mg via RESPIRATORY_TRACT
  Filled 2021-11-18: qty 3

## 2021-11-22 ENCOUNTER — Other Ambulatory Visit: Payer: Self-pay | Admitting: Medical

## 2021-11-25 ENCOUNTER — Other Ambulatory Visit: Payer: Self-pay | Admitting: Medical

## 2021-11-25 NOTE — Telephone Encounter (Signed)
Dr. Loanne Drilling, please advise on pt's message. He is requesting the results of his PFTs.   Pharmacy, please advise on pt's Breo PA. Phone note from 8/16 states the PA is still pending. Please see pt's message.

## 2021-11-28 ENCOUNTER — Telehealth: Payer: Self-pay | Admitting: Medical

## 2021-11-28 ENCOUNTER — Other Ambulatory Visit: Payer: Self-pay | Admitting: Medical

## 2021-11-28 MED ORDER — METHYLPHENIDATE HCL ER (LA) 30 MG PO CP24: 30.0000 mg | ORAL_CAPSULE | Freq: Every day | ORAL | 0 refills | Status: DC

## 2021-11-28 MED ORDER — FLUTICASONE FUROATE-VILANTEROL 200-25 MCG/ACT IN AEPB: 1.0000 | INHALATION_SPRAY | Freq: Every day | RESPIRATORY_TRACT | 5 refills | Status: DC

## 2021-11-28 NOTE — Addendum Note (Signed)
Addended by: Rodman Pickle on: 11/28/2021 05:36 PM   Modules accepted: Orders

## 2021-11-28 NOTE — Telephone Encounter (Signed)
Patient Advocate Encounter  Received a fax from [place] regarding Prior Authorization for [employer].   Authorization has been DENIED due to invalid diagnosis code. Determination letter added to patient chart.  Clista Bernhardt, CPhT Rx Patient Advocate Phone: [phone]

## 2021-11-28 NOTE — Telephone Encounter (Signed)
Pt requesting refill on ritalin to the same pharmacy.

## 2021-11-28 NOTE — Telephone Encounter (Signed)
Refill request for testosterone gel last apt 09/23/21 next apt 12/08/21.

## 2021-11-28 NOTE — Telephone Encounter (Signed)
Schedule at this time for either physical or med check.  He is due for labs and recheck on testosterone.  Let me know when you have something scheduled and I will send out refill

## 2021-11-28 NOTE — Telephone Encounter (Signed)
[facility] Results Encounter  I contacted patient to discuss PFT results.  Methacholine challenge positive for hyperreactive airways with FEV1 reduction by 26% at 16 mg/ml.  Clinically he has symptoms of cough and wheezing that is consistent with asthma with known abnormal PFT  Mild persistent asthma --START Breo 200-25 mcg ONE puff ONCE a day. Re-ordered with new diagnosis code attached.

## 2021-11-30 ENCOUNTER — Other Ambulatory Visit: Payer: Self-pay | Admitting: Medical

## 2021-11-30 ENCOUNTER — Other Ambulatory Visit (HOSPITAL_COMMUNITY): Payer: Self-pay

## 2021-11-30 ENCOUNTER — Telehealth: Payer: Self-pay | Admitting: Medical

## 2021-11-30 ENCOUNTER — Telehealth: Payer: Self-pay | Admitting: Pulmonary Disease

## 2021-11-30 MED ORDER — TESTOSTERONE 20.25 MG/1.25GM (1.62%) TD GEL: 2.0000 | Freq: Every day | TRANSDERMAL | 0 refills | Status: DC

## 2021-11-30 NOTE — Telephone Encounter (Signed)
Patient called to inform the doctor that he got his Breo medication approved.  He just wanted the doctor to know.

## 2021-11-30 NOTE — Telephone Encounter (Signed)
Pt called and is requesting a refill on his testosterone [id number] [place] [address]

## 2021-12-01 ENCOUNTER — Encounter: Payer: BC Managed Care – PPO | Admitting: Medical

## 2021-12-07 ENCOUNTER — Encounter: Payer: Self-pay | Admitting: Internal Medicine

## 2021-12-08 ENCOUNTER — Ambulatory Visit: Payer: BC Managed Care – PPO | Admitting: Medical

## 2021-12-08 ENCOUNTER — Encounter: Payer: Self-pay | Admitting: Medical

## 2021-12-08 VITALS — BP 124/80 | HR 88 | Ht 69.0 in | Wt 181.6 lb

## 2021-12-08 DIAGNOSIS — E785 Hyperlipidemia, unspecified: Secondary | ICD-10-CM

## 2021-12-08 DIAGNOSIS — R41840 Attention and concentration deficit: Secondary | ICD-10-CM

## 2021-12-08 DIAGNOSIS — K449 Diaphragmatic hernia without obstruction or gangrene: Secondary | ICD-10-CM

## 2021-12-08 DIAGNOSIS — M810 Age-related osteoporosis without current pathological fracture: Secondary | ICD-10-CM

## 2021-12-08 DIAGNOSIS — Z0000 Encounter for general adult medical examination without abnormal findings: Secondary | ICD-10-CM

## 2021-12-08 DIAGNOSIS — E291 Testicular hypofunction: Secondary | ICD-10-CM | POA: Diagnosis not present

## 2021-12-08 DIAGNOSIS — F411 Generalized anxiety disorder: Secondary | ICD-10-CM

## 2021-12-08 DIAGNOSIS — J3089 Other allergic rhinitis: Secondary | ICD-10-CM

## 2021-12-08 DIAGNOSIS — G4700 Insomnia, unspecified: Secondary | ICD-10-CM

## 2021-12-08 DIAGNOSIS — R319 Hematuria, unspecified: Secondary | ICD-10-CM

## 2021-12-08 DIAGNOSIS — Z23 Encounter for immunization: Secondary | ICD-10-CM | POA: Diagnosis not present

## 2021-12-08 DIAGNOSIS — Z8249 Family history of ischemic heart disease and other diseases of the circulatory system: Secondary | ICD-10-CM

## 2021-12-08 DIAGNOSIS — I8393 Asymptomatic varicose veins of bilateral lower extremities: Secondary | ICD-10-CM

## 2021-12-08 DIAGNOSIS — M25521 Pain in right elbow: Secondary | ICD-10-CM

## 2021-12-08 DIAGNOSIS — R7989 Other specified abnormal findings of blood chemistry: Secondary | ICD-10-CM

## 2021-12-08 DIAGNOSIS — E559 Vitamin D deficiency, unspecified: Secondary | ICD-10-CM

## 2021-12-08 DIAGNOSIS — J45991 Cough variant asthma: Secondary | ICD-10-CM

## 2021-12-08 DIAGNOSIS — K635 Polyp of colon: Secondary | ICD-10-CM

## 2021-12-08 DIAGNOSIS — F319 Bipolar disorder, unspecified: Secondary | ICD-10-CM

## 2021-12-08 DIAGNOSIS — S5401XA Injury of ulnar nerve at forearm level, right arm, initial encounter: Secondary | ICD-10-CM

## 2021-12-08 DIAGNOSIS — K5790 Diverticulosis of intestine, part unspecified, without perforation or abscess without bleeding: Secondary | ICD-10-CM

## 2021-12-08 DIAGNOSIS — Z9889 Other specified postprocedural states: Secondary | ICD-10-CM

## 2021-12-08 DIAGNOSIS — R053 Chronic cough: Secondary | ICD-10-CM

## 2021-12-08 DIAGNOSIS — Z125 Encounter for screening for malignant neoplasm of prostate: Secondary | ICD-10-CM

## 2021-12-08 DIAGNOSIS — Z7185 Encounter for immunization safety counseling: Secondary | ICD-10-CM

## 2021-12-08 DIAGNOSIS — Z136 Encounter for screening for cardiovascular disorders: Secondary | ICD-10-CM

## 2021-12-08 LAB — POCT URINALYSIS DIP (PROADVANTAGE DEVICE)
Bilirubin, UA: NEGATIVE
Glucose, UA: NEGATIVE mg/dL
Ketones, POC UA: NEGATIVE mg/dL
Leukocytes, UA: NEGATIVE
Nitrite, UA: NEGATIVE
Protein Ur, POC: NEGATIVE mg/dL
Specific Gravity, Urine: 1.02
Urobilinogen, Ur: NEGATIVE
pH, UA: 6 (ref 5.0–8.0)

## 2021-12-08 NOTE — Addendum Note (Signed)
Addended by: Minette Headland A on: 12/08/2021 12:25 PM   Modules accepted: Orders
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
## 2021-12-09 ENCOUNTER — Telehealth: Payer: Self-pay | Admitting: Medical

## 2021-12-09 ENCOUNTER — Other Ambulatory Visit: Payer: Self-pay | Admitting: Medical

## 2021-12-09 LAB — CBC
Hematocrit: 46.9 % (ref 37.5–51.0)
Hemoglobin: 15.3 g/dL (ref 13.0–17.7)
MCH: 27.7 pg (ref 26.6–33.0)
MCHC: 32.6 g/dL (ref 31.5–35.7)
MCV: 85 fL (ref 79–97)
Platelets: 330 10*3/uL (ref 150–450)
RBC: 5.52 x10E6/uL (ref 4.14–5.80)
RDW: 14.5 % (ref 11.6–15.4)
WBC: 9.6 10*3/uL (ref 3.4–10.8)

## 2021-12-09 LAB — COMPREHENSIVE METABOLIC PANEL
ALT: 72 IU/L — ABNORMAL HIGH (ref 0–44)
AST: 55 IU/L — ABNORMAL HIGH (ref 0–40)
Albumin/Globulin Ratio: 1.8 (ref 1.2–2.2)
Albumin: 4.7 g/dL (ref 3.8–4.9)
Alkaline Phosphatase: 50 IU/L (ref 44–121)
BUN/Creatinine Ratio: 17 (ref 10–24)
BUN: 19 mg/dL (ref 8–27)
Bilirubin Total: 0.5 mg/dL (ref 0.0–1.2)
CO2: 22 mmol/L (ref 20–29)
Calcium: 9.9 mg/dL (ref 8.6–10.2)
Chloride: 105 mmol/L (ref 96–106)
Creatinine, Ser: 1.12 mg/dL (ref 0.76–1.27)
Globulin, Total: 2.6 g/dL (ref 1.5–4.5)
Glucose: 106 mg/dL — ABNORMAL HIGH (ref 70–99)
Potassium: 4.6 mmol/L (ref 3.5–5.2)
Sodium: 145 mmol/L — ABNORMAL HIGH (ref 134–144)
Total Protein: 7.3 g/dL (ref 6.0–8.5)
eGFR: 75 mL/min/{1.73_m2} (ref >59)

## 2021-12-09 LAB — URINALYSIS, MICROSCOPIC ONLY
Bacteria, UA: NONE SEEN
Casts: NONE SEEN /lpf
Epithelial Cells (non renal): NONE SEEN /hpf (ref 0–10)
WBC, UA: NONE SEEN /hpf (ref 0–5)

## 2021-12-09 LAB — LIPID PANEL
Chol/HDL Ratio: 2.6 ratio (ref 0.0–5.0)
Cholesterol, Total: 163 mg/dL (ref 100–199)
HDL: 63 mg/dL (ref >39)
LDL Chol Calc (NIH): 83 mg/dL (ref 0–99)
Triglycerides: 95 mg/dL (ref 0–149)
VLDL Cholesterol Cal: 17 mg/dL (ref 5–40)

## 2021-12-09 LAB — TESTOSTERONE: Testosterone: 111 ng/dL — ABNORMAL LOW (ref 264–916)

## 2021-12-09 LAB — PSA: Prostate Specific Ag, Serum: 0.8 ng/mL (ref 0.0–4.0)

## 2021-12-09 MED ORDER — TESTOSTERONE 20.25 MG/1.25GM (1.62%) TD GEL
3.0000 | Freq: Every day | TRANSDERMAL | 1 refills | Status: DC
Start: 2021-12-09 — End: 2022-02-01

## 2021-12-09 NOTE — Telephone Encounter (Signed)
Pt called you back but you weren't at your desk. I let him know you would call again when you can.

## 2021-12-26 ENCOUNTER — Other Ambulatory Visit: Payer: Self-pay | Admitting: Medical

## 2021-12-27 ENCOUNTER — Encounter: Payer: Self-pay | Admitting: Pulmonary Disease

## 2021-12-27 ENCOUNTER — Ambulatory Visit: Payer: BC Managed Care – PPO | Admitting: Pulmonary Disease

## 2021-12-27 VITALS — BP 132/84 | HR 90 | Ht 69.0 in | Wt 181.8 lb

## 2021-12-27 DIAGNOSIS — J4550 Severe persistent asthma, uncomplicated: Secondary | ICD-10-CM | POA: Diagnosis not present

## 2021-12-27 MED ORDER — FLUTICASONE-SALMETEROL 500-50 MCG/ACT IN AEPB: 1.0000 | INHALATION_SPRAY | Freq: Two times a day (BID) | RESPIRATORY_TRACT | 5 refills | Status: DC

## 2021-12-27 MED ORDER — HYDROCOD POLI-CHLORPHE POLI ER 10-8 MG/5ML PO SUER
5.0000 mL | Freq: Every evening | ORAL | 0 refills | Status: DC | PRN
Start: 2021-12-27 — End: 2022-06-01
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
## 2021-12-27 NOTE — Patient Instructions (Signed)
Severe persistent asthma - uncontrolled, no exacerbations --STOP Breo --START Advair 500-50 mcg ONE puff in the morning and evening. Rinse mouth out after use. --REORDER cough syrup nightly  Hx esophageal dilation, hiatal hernia --CONTINUE protonix 40 mg twice daily  Follow-up with me in 2 months (November/December)

## 2021-12-30 ENCOUNTER — Telehealth: Payer: Self-pay | Admitting: Pulmonary Disease

## 2021-12-30 NOTE — Telephone Encounter (Signed)
Called and spoke with patient.  Patient stated he was wanting a note from Dr. Loanne Drilling stating he should work from home.  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  Patient stated he has had coughing spells that brings concern from co workers.  Patient stated he also felt the air at work may be making his cough/asthma worse.  Patient stated Dr. Loanne Drilling is trying different treatments to get his asthma/cough under control, but as of now he is still having issues.  Patient stated he needed to work from home at least until his asthma and cough have become controlled.    Message routed to Dr. Loanne Drilling to advise

## 2021-12-31 ENCOUNTER — Telehealth: Payer: Self-pay | Admitting: Medical

## 2021-12-31 NOTE — Telephone Encounter (Signed)
P.A. TESTOSTERONE GEL 1.62% 

## 2022-01-01 ENCOUNTER — Other Ambulatory Visit: Payer: Self-pay | Admitting: Medical

## 2022-01-03 ENCOUNTER — Telehealth: Payer: Self-pay | Admitting: Medical

## 2022-01-03 ENCOUNTER — Other Ambulatory Visit: Payer: Self-pay | Admitting: Medical

## 2022-01-03 MED ORDER — METHYLPHENIDATE HCL ER (LA) 30 MG PO CP24: 30.0000 mg | ORAL_CAPSULE | Freq: Every day | ORAL | 0 refills | Status: DC

## 2022-01-03 NOTE — Telephone Encounter (Signed)
Work note completed and available in [facility]. Please notify patient.

## 2022-01-03 NOTE — Telephone Encounter (Signed)
[facility]

## 2022-01-04 NOTE — Telephone Encounter (Signed)
Attempted to call pt but unable to reach. Left a detailed message that he should be able to print work note from [place]. Nothing further needed.

## 2022-01-07 NOTE — Telephone Encounter (Signed)
done

## 2022-01-15 ENCOUNTER — Other Ambulatory Visit: Payer: Self-pay | Admitting: Medical

## 2022-01-25 ENCOUNTER — Other Ambulatory Visit: Payer: Self-pay | Admitting: Gastroenterology

## 2022-01-25 DIAGNOSIS — K222 Esophageal obstruction: Secondary | ICD-10-CM

## 2022-01-25 DIAGNOSIS — R933 Abnormal findings on diagnostic imaging of other parts of digestive tract: Secondary | ICD-10-CM

## 2022-01-25 DIAGNOSIS — R1319 Other dysphagia: Secondary | ICD-10-CM

## 2022-02-01 ENCOUNTER — Other Ambulatory Visit: Payer: Self-pay | Admitting: Medical

## 2022-02-01 MED ORDER — METHYLPHENIDATE HCL ER (LA) 30 MG PO CP24: 30.0000 mg | ORAL_CAPSULE | Freq: Every day | ORAL | 0 refills | Status: DC

## 2022-02-17 ENCOUNTER — Telehealth: Payer: BC Managed Care – PPO | Admitting: Family Medicine

## 2022-02-17 DIAGNOSIS — B9689 Other specified bacterial agents as the cause of diseases classified elsewhere: Secondary | ICD-10-CM | POA: Diagnosis not present

## 2022-02-17 DIAGNOSIS — J019 Acute sinusitis, unspecified: Secondary | ICD-10-CM

## 2022-02-17 DIAGNOSIS — J45991 Cough variant asthma: Secondary | ICD-10-CM | POA: Diagnosis not present

## 2022-02-17 MED ORDER — AMOXICILLIN-POT CLAVULANATE 875-125 MG PO TABS: 1.0000 | ORAL_TABLET | Freq: Two times a day (BID) | ORAL | 0 refills | Status: AC

## 2022-02-17 MED ORDER — ALBUTEROL SULFATE HFA 108 (90 BASE) MCG/ACT IN AERS: 2.0000 | INHALATION_SPRAY | Freq: Four times a day (QID) | RESPIRATORY_TRACT | 0 refills | Status: DC | PRN

## 2022-02-17 MED ORDER — BENZONATATE 100 MG PO CAPS: 100.0000 mg | ORAL_CAPSULE | Freq: Two times a day (BID) | ORAL | 0 refills | Status: DC | PRN

## 2022-02-17 NOTE — Patient Instructions (Signed)
[name], thank you for joining Perlie Mayo, NP for today's virtual visit.  While this provider is not your primary care provider (PCP), if your PCP is located in our provider database this encounter information will be shared with them immediately following your visit.   [facility] account gives you access to today's visit and all your visits, tests, and labs performed at [facility] " click here if you don't have a [facility] account or go to [email or web address]  Consent: (Patient) Jesse Hardin provided verbal consent for this virtual visit at the beginning of the encounter.  Current Medications:  Current Outpatient Medications:    albuterol (VENTOLIN HFA) 108 (90 Base) MCG/ACT inhaler, Inhale 2 puffs into the lungs every 6 (six) hours as needed for wheezing or shortness of breath., Disp: 8 g, Rfl: 0   amoxicillin-clavulanate (AUGMENTIN) 875-125 MG tablet, Take 1 tablet by mouth 2 (two) times daily for 7 days., Disp: 14 tablet, Rfl: 0   benzonatate (TESSALON) 100 MG capsule, Take 1 capsule (100 mg total) by mouth 2 (two) times daily as needed for cough., Disp: 20 capsule, Rfl: 0   B Complex Vitamins ([facility] BALANCED B100) TBCR, TAKE 1 TABLET BY MOUTH EVERY DAY, Disp: 90 tablet, Rfl: 1   b complex vitamins capsule, Take 1 capsule by mouth daily., Disp: 30 capsule, Rfl: 1   buPROPion (WELLBUTRIN XL) 300 MG 24 hr tablet, TAKE 1 TABLET BY MOUTH EVERY DAY, Disp: 90 tablet, Rfl: 0   chlorpheniramine-HYDROcodone (TUSSIONEX) 10-8 MG/5ML, Take 5 mLs by mouth at bedtime as needed for cough., Disp: 473 mL, Rfl: 0   Cholecalciferol (VITAMIN D3) 50 MCG (2000 UT) capsule, TAKE 1 CAPSULE BY MOUTH EVERY DAY, Disp: 90 capsule, Rfl: 1   fexofenadine (ALLEGRA) 60 MG tablet, Take 60 mg by mouth daily as needed., Disp: , Rfl:    fluticasone-salmeterol (ADVAIR) 500-50 MCG/ACT AEPB, Inhale 1 puff into the lungs in the morning and at bedtime., Disp: 60 each,  Rfl: 5   methylphenidate (RITALIN LA) 30 MG 24 hr capsule, Take 1 capsule (30 mg total) by mouth daily., Disp: 30 capsule, Rfl: 0   pantoprazole (PROTONIX) 40 MG tablet, TAKE 1 TABLET BY MOUTH TWICE A DAY, Disp: 180 tablet, Rfl: 2   rosuvastatin (CRESTOR) 10 MG tablet, TAKE 1 TABLET BY MOUTH EVERY DAY, Disp: 90 tablet, Rfl: 0   Testosterone 20.25 MG/ACT (1.62%) GEL, APPLY 3 SQUIRTS ONTO THE SKIN DAILY, Disp: 75 g, Rfl: 1   Medications ordered in this encounter:  Meds ordered this encounter  Medications   albuterol (VENTOLIN HFA) 108 (90 Base) MCG/ACT inhaler    Sig: Inhale 2 puffs into the lungs every 6 (six) hours as needed for wheezing or shortness of breath.    Dispense:  8 g    Refill:  0    Order Specific Question:   Supervising Provider    Answer:   Chase Picket [id number]   amoxicillin-clavulanate (AUGMENTIN) 875-125 MG tablet    Sig: Take 1 tablet by mouth 2 (two) times daily for 7 days.    Dispense:  14 tablet    Refill:  0    Order Specific Question:   Supervising Provider    Answer:   Chase Picket [[id number]]   benzonatate (TESSALON) 100 MG capsule    Sig: Take 1 capsule (100 mg total) by mouth 2 (two) times daily as needed for cough.    Dispense:  20 capsule    Refill:  0    Order Specific Question:   Supervising Provider    Answer:   Chase Picket [[id number]]     *If you need refills on other medications prior to your next appointment, please contact your pharmacy*  Follow-Up: Call back or seek an in-person evaluation if the symptoms worsen or if the condition fails to improve as anticipated.  [facility] [phone]  Other Instructions  -Take meds as prescribed -Rest -Use a cool mist humidifier especially during the winter months when heat dries out the air. - Use saline nose sprays frequently to help soothe nasal passages and promote drainage. -Saline irrigations of the nose can be very helpful if done frequently.             * 4X  daily for 1 week*             * Use of a nettie pot can be helpful with this.  *Follow directions with this* *Boiled or distilled water only -stay hydrated by drinking plenty of fluids - Keep thermostat turn down low to prevent drying out sinuses - For any cough or congestion- robitussin DM or Delsym as needed - For fever or aches or pains- take tylenol or ibuprofen as directed on bottle             * for fevers greater than 101 orally you may alternate ibuprofen and tylenol every 3 hours.  If you do not improve you will need a follow up visit in person.                  If you have been instructed to have an in-person evaluation today at a local Urgent Care facility, please use the link below. It will take you to a list of all of our available [facility], including address, phone number and hours of operation. Please do not delay care.  [facility]  If you or a family member do not have a primary care provider, use the link below to schedule a visit and establish care. When you choose a [facility] primary care physician or advanced practice provider, you gain a long-term partner in health. Find a Primary Care Provider  Learn more about [facility] in-office and virtual care options: [facility]

## 2022-02-17 NOTE — Progress Notes (Signed)
Virtual Visit Consent   Child Campoy, you are scheduled for a virtual visit with a [facility] provider today. Just as with appointments in the office, your consent must be obtained to participate. Your consent will be active for this visit and any virtual visit you may have with one of our providers in the next 365 days. If you have a MyChart account, a copy of this consent can be sent to you electronically.  As this is a virtual visit, video technology does not allow for your provider to perform a traditional examination. This may limit your provider's ability to fully assess your condition. If your provider identifies any concerns that need to be evaluated in person or the need to arrange testing (such as labs, EKG, etc.), we will make arrangements to do so. Although advances in technology are sophisticated, we cannot ensure that it will always work on either your end or our end. If the connection with a video visit is poor, the visit may have to be switched to a telephone visit. With either a video or telephone visit, we are not always able to ensure that we have a secure connection.  By engaging in this virtual visit, you consent to the provision of healthcare and authorize for your insurance to be billed (if applicable) for the services provided during this visit. Depending on your insurance coverage, you may receive a charge related to this service.  I need to obtain your verbal consent now. Are you willing to proceed with your visit today? Jesse Hardin has provided verbal consent on 02/17/2022 for a virtual visit (video or telephone). Perlie Mayo, NP  Date: 02/17/2022 11:06 AM  Virtual Visit via Video Note   I, Perlie Mayo, connected with  Jesse Hardin  ([id number], Nov 08, 1960) on 02/17/22 at 11:00 AM EST by a video-enabled telemedicine application and verified that I am speaking with the correct person using two identifiers.  Location: Patient: Virtual Visit  Location Patient: Home Provider: Virtual Visit Location Provider: Home Office   I discussed the limitations of evaluation and management by telemedicine and the availability of in person appointments. The patient expressed understanding and agreed to proceed.    History of Present Illness: Jesse Hardin is a 61 y.o. who identifies as a male who was assigned male at birth, and is being seen today for Saturday started with sore throat that has progressed through the week.  Nasal congestion that has changed from clear to yellow and blood tinged. Sinus pressure- headaches, body aches, cough, does get winded - has asthma, but shortness of breath is not abnormal as of yet. Denies fevers, chills, chest pain, or ear pain. Denies wheezing and chest tightness. COVID negative, on a home test.   Problems:  Patient Active Problem List   Diagnosis Date Noted   Cough variant asthma 12/08/2021   Chronic cough 09/23/2021   Hiatal hernia 09/23/2021   History of esophageal dilatation 09/23/2021   Tongue fissure 09/23/2021   Non-seasonal allergic rhinitis 06/03/2021   Asymptomatic varicose veins of both lower extremities 05/27/2021   Osteoporosis without current pathological fracture 01/04/2021   Screening for prostate cancer 11/24/2020   Radiculitis 11/24/2020   Screening for heart disease 11/24/2020   Attention and concentration deficit 07/08/2020   Hyperlipidemia 07/08/2020   Hemorrhoids 03/30/2020   Diverticulosis 03/30/2020   Polyp of colon 03/30/2020   Vaccine counseling 06/20/2017   Encounter for health maintenance examination in adult 06/20/2017   Family history of heart disease 06/20/2017  Screen for colon cancer 06/20/2017   Generalized anxiety disorder 01/03/2016   Vitamin D deficiency 11/16/2015   Bipolar disorder with depression ([place]) 11/01/2015   Insomnia 11/01/2015   Low testosterone 11/01/2015   Hypogonadism in male 11/01/2015    Allergies: No Known Allergies Medications:   Current Outpatient Medications:    B Complex Vitamins ([facility]S BALANCED B100) TBCR, TAKE 1 TABLET BY MOUTH EVERY DAY, Disp: 90 tablet, Rfl: 1   b complex vitamins capsule, Take 1 capsule by mouth daily., Disp: 30 capsule, Rfl: 1   buPROPion (WELLBUTRIN XL) 300 MG 24 hr tablet, TAKE 1 TABLET BY MOUTH EVERY DAY, Disp: 90 tablet, Rfl: 0   chlorpheniramine-HYDROcodone (TUSSIONEX) 10-8 MG/5ML, Take 5 mLs by mouth at bedtime as needed for cough., Disp: 473 mL, Rfl: 0   Cholecalciferol (VITAMIN D3) 50 MCG (2000 UT) capsule, TAKE 1 CAPSULE BY MOUTH EVERY DAY, Disp: 90 capsule, Rfl: 1   fexofenadine (ALLEGRA) 60 MG tablet, Take 60 mg by mouth daily as needed., Disp: , Rfl:    fluticasone-salmeterol (ADVAIR) 500-50 MCG/ACT AEPB, Inhale 1 puff into the lungs in the morning and at bedtime., Disp: 60 each, Rfl: 5   methylphenidate (RITALIN LA) 30 MG 24 hr capsule, Take 1 capsule (30 mg total) by mouth daily., Disp: 30 capsule, Rfl: 0   pantoprazole (PROTONIX) 40 MG tablet, TAKE 1 TABLET BY MOUTH TWICE A DAY, Disp: 180 tablet, Rfl: 2   rosuvastatin (CRESTOR) 10 MG tablet, TAKE 1 TABLET BY MOUTH EVERY DAY, Disp: 90 tablet, Rfl: 0   Testosterone 20.25 MG/ACT (1.62%) GEL, APPLY 3 SQUIRTS ONTO THE SKIN DAILY, Disp: 75 g, Rfl: 1  Observations/Objective: Patient is well-developed, well-nourished in no acute distress.  Resting comfortably  at home.  Head is normocephalic, atraumatic.  No labored breathing.  Speech is clear and coherent with logical content.  Patient is alert and oriented at baseline.  Cough appreciated   Assessment and Plan:  1. Acute bacterial sinusitis  - albuterol (VENTOLIN HFA) 108 (90 Base) MCG/ACT inhaler; Inhale 2 puffs into the lungs every 6 (six) hours as needed for wheezing or shortness of breath.  Dispense: 8 g; Refill: 0 - amoxicillin-clavulanate (AUGMENTIN) 875-125 MG tablet; Take 1 tablet by mouth 2 (two) times daily for 7 days.  Dispense: 14 tablet; Refill: 0 - benzonatate  (TESSALON) 100 MG capsule; Take 1 capsule (100 mg total) by mouth 2 (two) times daily as needed for cough.  Dispense: 20 capsule; Refill: 0  2. Cough variant asthma  - albuterol (VENTOLIN HFA) 108 (90 Base) MCG/ACT inhaler; Inhale 2 puffs into the lungs every 6 (six) hours as needed for wheezing or shortness of breath.  Dispense: 8 g; Refill: 0 - benzonatate (TESSALON) 100 MG capsule; Take 1 capsule (100 mg total) by mouth 2 (two) times daily as needed for cough.  Dispense: 20 capsule; Refill: 0  -no red flags during visit, S&S consistent for bacterial sinus infection -use rescue inhaler for uncontrolled cough -Take meds as prescribed -Rest -Use a cool mist humidifier especially during the winter months when heat dries out the air. - Use saline nose sprays frequently to help soothe nasal passages and promote drainage. -Saline irrigations of the nose can be very helpful if done frequently.             * 4X daily for 1 week*             * Use of a nettie pot can be helpful with this.  *Follow  directions with this* *Boiled or distilled water only -stay hydrated by drinking plenty of fluids - Keep thermostat turn down low to prevent drying out sinuses - For any cough or congestion- robitussin DM or Delsym as needed - For fever or aches or pains- take tylenol or ibuprofen as directed on bottle             * for fevers greater than 101 orally you may alternate ibuprofen and tylenol every 3 hours.  If you do not improve you will need a follow up visit in person.                Reviewed side effects, risks and benefits of medication.    Patient acknowledged agreement and understanding of the plan.   Past Medical, Surgical, Social History, Allergies, and Medications have been Reviewed.    Follow Up Instructions: I discussed the assessment and treatment plan with the patient. The patient was provided an opportunity to ask questions and all were answered. The patient agreed with the plan and  demonstrated an understanding of the instructions.  A copy of instructions were sent to the patient via MyChart unless otherwise noted below.     The patient was advised to call back or seek an in-person evaluation if the symptoms worsen or if the condition fails to improve as anticipated.  Time:  I spent 10 minutes with the patient via telehealth technology discussing the above problems/concerns.    Perlie Mayo, NP

## 2022-02-22 ENCOUNTER — Other Ambulatory Visit: Payer: Self-pay | Admitting: Medical

## 2022-02-27 ENCOUNTER — Encounter (HOSPITAL_BASED_OUTPATIENT_CLINIC_OR_DEPARTMENT_OTHER): Payer: Self-pay | Admitting: Pulmonary Disease

## 2022-02-27 ENCOUNTER — Ambulatory Visit (INDEPENDENT_AMBULATORY_CARE_PROVIDER_SITE_OTHER): Payer: BC Managed Care – PPO | Admitting: Pulmonary Disease

## 2022-02-27 VITALS — BP 150/60 | HR 104 | Ht 69.0 in | Wt 180.0 lb

## 2022-02-27 DIAGNOSIS — J4550 Severe persistent asthma, uncomplicated: Secondary | ICD-10-CM | POA: Diagnosis not present

## 2022-02-27 DIAGNOSIS — R053 Chronic cough: Secondary | ICD-10-CM

## 2022-02-27 MED ORDER — FLUTICASONE-SALMETEROL 500-50 MCG/ACT IN AEPB: 1.0000 | INHALATION_SPRAY | Freq: Two times a day (BID) | RESPIRATORY_TRACT | 5 refills | Status: DC

## 2022-02-27 NOTE — Patient Instructions (Addendum)
Severe persistent asthma - well-controlled --CONTINUE Advair 500-50 mcg ONE puff in the morning and evening. Rinse mouth out after use. --Ok to decrease to once a day during non-allergy seasons  Follow-up with me in 6 months

## 2022-02-27 NOTE — Progress Notes (Signed)
Subjective:   PATIENT ID: [name] GENDER: male DOB: [birth year], MRN: [id number]   HPI  Chief Complaint  Patient presents with   Follow-up    Advair is working not as well as last inhaler    Reason for Visit: Follow-up  [name] is a 61 year old male with GERD, hiatal hernia, s/p esophageal dilation x 2 in 2022 who presents for follow-up asthma  Initial consult He reports allergies for month that will trigger his cough however this year he has had a cough for 6 months with no trigger that has persisted. He will have nonproductive coughing spells that are severe and can last 10-15 min. Reports wheezing 1-2 x week during the daytime. Has coughing that will awaken him. Treated with Tussionex with improvement in symptoms and has allowed him to sleep. Allegra and zyrtec was not effective. Denies nasal congestion. On protonix 40 mg twice daily  12/27/21 He reports compliance with Breo 200 daily. Not effective. Maybe some benefit in cough in the mornings but worsening symptoms during the afternoon. Not able to get the breaths that he needs. He is no longer wheezing. He wakes up nightly with coughing spells that will lead to gagging. Mucinex is ineffective.  02/27/22 Since our last visit he was transitioned from [facility] to [place] [facility]. He was recently seen on 11/71/23 by NP Jerelene Redden in [facility] for sinus pressure and nasal congestion and treated with acute bacterial sinusitis with augmentin and tessalon perles. Prior to illness he reports near resolution of cough. Medications has helped as well as isolation at his workplace with separate office with door. No shortness of breath or cough.      No data to display          Social History: Never smoker   Past Medical History:  Diagnosis Date   Allergy    Anxiety    Bipolar disorder ([place])    "quick cycler", hx/o manic episodes if off medication   Colon polyps 06/26/2011   Ascending colon   Depression     Diverticulitis of colon 06/26/2011   per colonoscopy   GERD (gastroesophageal reflux disease)    Grade I internal hemorrhoids 06/26/2011   colonscopy.    HLD (hyperlipidemia)    Insomnia    Migraine    Osteoporosis    Pneumonia    Sexual dysfunction      Family History  Problem Relation Age of Onset   Depression Mother    Heart disease Father 15       CABG   Depression Sister    Depression Sister    Cancer Sister        breast lesions   Cancer Paternal Grandmother        type unknown, ? lung, heavy smoker   Stroke Neg Hx    Diabetes Neg Hx    Colon cancer Neg Hx    Rectal cancer Neg Hx    Stomach cancer Neg Hx      Social History   Occupational History   Occupation: Armed forces training and education officer  Tobacco Use   Smoking status: Never   Smokeless tobacco: Never  Vaping Use   Vaping Use: Never used  Substance and Sexual Activity   Alcohol use: Yes    Alcohol/week: 0.0 standard drinks of alcohol    Comment: 1-2 per year   Drug use: No   Sexual activity: Not on file    No Known Allergies   Outpatient Medications Prior to  Visit  Medication Sig Dispense Refill   B Complex Vitamins ([facility]S BALANCED B100) TBCR TAKE 1 TABLET BY MOUTH EVERY DAY 90 tablet 1   buPROPion (WELLBUTRIN XL) 300 MG 24 hr tablet TAKE 1 TABLET BY MOUTH EVERY DAY 90 tablet 1   chlorpheniramine-HYDROcodone (TUSSIONEX) 10-8 MG/5ML Take 5 mLs by mouth at bedtime as needed for cough. 473 mL 0   Cholecalciferol (VITAMIN D3) 50 MCG (2000 UT) capsule TAKE 1 CAPSULE BY MOUTH EVERY DAY 90 capsule 1   fexofenadine (ALLEGRA) 60 MG tablet Take 60 mg by mouth daily as needed.     methylphenidate (RITALIN LA) 30 MG 24 hr capsule Take 1 capsule (30 mg total) by mouth daily. 30 capsule 0   pantoprazole (PROTONIX) 40 MG tablet TAKE 1 TABLET BY MOUTH TWICE A DAY 180 tablet 2   rosuvastatin (CRESTOR) 10 MG tablet TAKE 1 TABLET BY MOUTH EVERY DAY 90 tablet 0   Testosterone 20.25 MG/ACT (1.62%) GEL APPLY 3 SQUIRTS ONTO THE SKIN DAILY  75 g 1   fluticasone-salmeterol (ADVAIR) 500-50 MCG/ACT AEPB Inhale 1 puff into the lungs in the morning and at bedtime. 60 each 5   albuterol (VENTOLIN HFA) 108 (90 Base) MCG/ACT inhaler Inhale 2 puffs into the lungs every 6 (six) hours as needed for wheezing or shortness of breath. (Patient not taking: Reported on 02/27/2022) 8 g 0   b complex vitamins capsule Take 1 capsule by mouth daily. 30 capsule 1   benzonatate (TESSALON) 100 MG capsule Take 1 capsule (100 mg total) by mouth 2 (two) times daily as needed for cough. 20 capsule 0   No facility-administered medications prior to visit.    Review of Systems  Constitutional:  Negative for chills, diaphoresis, fever, malaise/fatigue and weight loss.  HENT:  Negative for congestion.   Respiratory:  Negative for cough, hemoptysis, sputum production, shortness of breath and wheezing.   Cardiovascular:  Negative for chest pain, palpitations and leg swelling.     Objective:   Vitals:   02/27/22 1316  BP: (!) 150/60  Pulse: (!) 104  SpO2: 97%  Weight: 180 lb (81.6 kg)  Height: '5\' 9"'$  (1.753 m)   SpO2: 97 % O2 Device: None (Room air)  Physical Exam: General: Well-appearing, no acute distress HENT: [place], AT Eyes: EOMI, no scleral icterus Neuro: AAO x4, CNII-XII grossly intact Psych: Normal mood, normal affect  Data Reviewed:  Imaging: CXR 08/15/21 - Normal. No infiltrate, effusion or edema  PFT: 09/23/21 FVC 3.6 (81%) FEV1 2.7 (80%) Ratio 76   Interpretation: Normal spirometry  11/18/21 Methacholine challenge positive for hyperreactive airways with FEV1 reduction by 26% at 16 mg/ml.  Labs: CBC    Component Value Date/Time   WBC 9.6 12/08/2021 0859   WBC 6.2 01/26/2017 0845   RBC 5.52 12/08/2021 0859   RBC 4.94 01/26/2017 0845   HGB 15.3 12/08/2021 0859   HCT 46.9 12/08/2021 0859   PLT 330 12/08/2021 0859   MCV 85 12/08/2021 0859   MCH 27.7 12/08/2021 0859   MCH 29.6 01/26/2017 0845   MCHC 32.6 12/08/2021 0859    MCHC 34.2 01/26/2017 0845   RDW 14.5 12/08/2021 0859   LYMPHSABS 2.4 09/03/2018 1400   EOSABS 0.1 09/03/2018 1400   BASOSABS 0.0 09/03/2018 1400   Absolute eos 09/03/18 - 100     Assessment & Plan:   Discussion: 61 year old male with GERD, hiatal hernia s/p esophageal dilation x 2 in 2002 who presents for follow-up. Well-controlled symptoms before his  current infection with near resolution of cough. Medications has helped as well as isolation at his workplace with separate office with door. Recent sinusitis which is improving.  Severe persistent asthma - well-controlled --CONTINUE Advair 500-50 mcg ONE puff in the morning and evening. Rinse mouth out after use. --Ok to decrease to once a day during non-allergy seasons  Hx esophageal dilation, hiatal hernia --CONTINUE protonix 40 mg twice daily  Health Maintenance Immunization History  Administered Date(s) Administered   Influenza Inj Mdck Quad Pf 01/21/2017   Influenza Split 02/15/2009   Influenza, Seasonal, Injecte, Preservative Fre 12/01/2013, 02/16/2015   Influenza,inj,Quad PF,6+ Mos 02/19/2018, 01/04/2021, 12/08/2021   Influenza-Unspecified 12/08/2017, 01/08/2019, 02/07/2020   Janssen (J&J) SARS-COV-2 Vaccination 06/13/2019   PFIZER(Purple Top)SARS-COV-2 Vaccination 01/26/2020   Pfizer Covid-19 Vaccine Bivalent Booster 38yr & up 01/04/2021   Pneumococcal Conjugate-13 12/08/2021   Tdap 04/07/2013   Unspecified SARS-COV-2 Vaccination 01/07/2022   CT Lung Screen - never smoker  No orders of the defined types were placed in this encounter.  Meds ordered this encounter  Medications   fluticasone-salmeterol (ADVAIR) 500-50 MCG/ACT AEPB    Sig: Inhale 1 puff into the lungs in the morning and at bedtime.    Dispense:  60 each    Refill:  5    Return in about 6 months (around 08/28/2022).  I have spent a total time of 25-minutes on the day of the appointment including chart review, data review, collecting history,  coordinating care and discussing medical diagnosis and plan with the patient/family. Past medical history, allergies, medications were reviewed. Pertinent imaging, labs and tests included in this note have been reviewed and interpreted independently by me.  CBiscay MD [facility] 02/27/2022 1:33 PM  Office Number [phone]

## 2022-03-02 ENCOUNTER — Other Ambulatory Visit: Payer: Self-pay | Admitting: Medical

## 2022-03-02 MED ORDER — METHYLPHENIDATE HCL ER (LA) 30 MG PO CP24
30.0000 mg | ORAL_CAPSULE | Freq: Every day | ORAL | 0 refills | Status: DC
Start: 2022-03-02 — End: 2022-03-31

## 2022-03-05 ENCOUNTER — Encounter (HOSPITAL_BASED_OUTPATIENT_CLINIC_OR_DEPARTMENT_OTHER): Payer: Self-pay | Admitting: Pulmonary Disease

## 2022-03-07 MED ORDER — FLUTICASONE-SALMETEROL 500-50 MCG/ACT IN AEPB: 1.0000 | INHALATION_SPRAY | Freq: Two times a day (BID) | RESPIRATORY_TRACT | 11 refills | Status: DC

## 2022-03-07 NOTE — Telephone Encounter (Signed)
I spoke with the pt  His insurance covers the generic advair 500  Needs [facility] to [facility] [address] [facility]  I have sent this in and nothing further needed per pt

## 2022-03-17 ENCOUNTER — Telehealth: Payer: Self-pay | Admitting: Medical

## 2022-03-17 NOTE — Telephone Encounter (Signed)
Pt called concerning Prolia. Pt advised medication approved with estimated cost of $317.00. Prior Josem Kaufmann was completed and approved. Pt was advised and agreed and scheduled for 03/21/2022. Medication ordered from [facility]. Pt advised to call as leaving so medication could be set out. Pt noted understanding.

## 2022-03-19 ENCOUNTER — Other Ambulatory Visit: Payer: Self-pay | Admitting: Medical

## 2022-03-20 ENCOUNTER — Ambulatory Visit: Payer: BC Managed Care – PPO | Admitting: Nurse Practitioner

## 2022-03-20 ENCOUNTER — Encounter: Payer: Self-pay | Admitting: Nurse Practitioner

## 2022-03-20 VITALS — BP 142/90 | HR 68 | Temp 98.5°F | Wt 185.6 lb

## 2022-03-20 DIAGNOSIS — H04301 Unspecified dacryocystitis of right lacrimal passage: Secondary | ICD-10-CM | POA: Diagnosis not present

## 2022-03-20 DIAGNOSIS — M810 Age-related osteoporosis without current pathological fracture: Secondary | ICD-10-CM | POA: Diagnosis not present

## 2022-03-20 DIAGNOSIS — H01131 Eczematous dermatitis of right upper eyelid: Secondary | ICD-10-CM

## 2022-03-20 DIAGNOSIS — H01132 Eczematous dermatitis of right lower eyelid: Secondary | ICD-10-CM

## 2022-03-20 DIAGNOSIS — H01134 Eczematous dermatitis of left upper eyelid: Secondary | ICD-10-CM

## 2022-03-20 DIAGNOSIS — H01135 Eczematous dermatitis of left lower eyelid: Secondary | ICD-10-CM

## 2022-03-20 MED ORDER — DENOSUMAB 60 MG/ML ~~LOC~~ SOSY
60.0000 mg | PREFILLED_SYRINGE | Freq: Once | SUBCUTANEOUS | Status: AC
  Administered 2022-03-20: 60 mg via SUBCUTANEOUS

## 2022-03-20 MED ORDER — BETAMETHASONE VALERATE 0.1 % EX LOTN: 1.0000 | TOPICAL_LOTION | Freq: Two times a day (BID) | CUTANEOUS | 0 refills | Status: DC

## 2022-03-20 MED ORDER — OLOPATADINE HCL 0.2 % OP SOLN: OPHTHALMIC | 3 refills | Status: DC

## 2022-03-20 NOTE — Assessment & Plan Note (Signed)
Upper and lower lid edema, erythema, and scaling consistent with eczematous irritation, likely related to increased irritation from swelling and drainage. Will send prescription for low potency topical steroid to be applied in very thin layer to the eye lid for 10-14 days. Patient instructed to cleanse eyelids with gentle tear free soap and rinse with cool water. No scrubbing. Use fingertips only to clean.

## 2022-03-20 NOTE — Assessment & Plan Note (Signed)
Erythema and edema to right upper eye lid with clear drainage from the eye. Recommend warm, moist compress, gentle milking of the lacrimal sac, and close monitoring for signs of worsening inflammation or infection. He does have an eye appointment tomorrow for another purpose, I have encouraged him to keep this appointment to allow the eye doctor the opportunity to evaluate. No signs of infection present at this time, but low threshold for starting antibiotic treatment. He will follow-up in the next 2-3 days if there is no improvement with conservative measures.

## 2022-03-20 NOTE — Patient Instructions (Signed)
Apply warm compress to the eye several times a day and milk the drainage ducts along the side of the nose as shown.   Apply the cream to the eye twice a day for at least 10 days.   Wash your face in a gentle scent free cleanser, baby shampoo works great.   If it seems to be getting worse or not getting better, please send me a message. We may need to add antibiotic eye drops, but I want to make sure this doesn't get better on its own.

## 2022-03-20 NOTE — Assessment & Plan Note (Signed)
Prolia injection due tomorrow. Will go ahead and give today since patient is in the office for acute visit.

## 2022-03-20 NOTE — Progress Notes (Signed)
Orma Render, DNP, AGNP-c [place] [address] [place], [place] [address] [phone]  Subjective:   Jesse Hardin is a 61 y.o. male presents to day for evaluation of: Right eye Irritation TJ endorses irritation, swelling, and drainage of the right eye. He reports several sinus infections over the last several months and reports the eye was progressively red, dry, and irritated on the upper and lower lids. His sinus symptoms have resolved, but he continues to have irritation from the right eye and recently it started to become more swollen. He has not tried anything for this. His vision is intact. He is not waking with his eye matted shut.   PMH, Medications, and Allergies reviewed and updated in chart as appropriate.   ROS negative except for what is listed in HPI. Objective:  BP (!) 142/90   Pulse 68   Temp 98.5 F (36.9 C)   Wt 185 lb 9.6 oz (84.2 kg)   BMI 27.41 kg/m  Physical Exam Vitals and nursing note reviewed.  Constitutional:      Appearance: He is ill-appearing.  HENT:     Head: Normocephalic.  Eyes:     General:        Right eye: Discharge present.   Cardiovascular:     Rate and Rhythm: Normal rate and regular rhythm.     Heart sounds: Normal heart sounds.  Pulmonary:     Effort: Pulmonary effort is normal.     Breath sounds: Normal breath sounds.  Skin:    General: Skin is warm and dry.     Capillary Refill: Capillary refill takes less than 2 seconds.  Neurological:     General: No focal deficit present.     Mental Status: He is alert.     Cranial Nerves: No cranial nerve deficit.  Psychiatric:        Mood and Affect: Mood normal.           Assessment & Plan:   Problem List Items Addressed This Visit     Osteoporosis without current pathological fracture    Prolia injection due tomorrow. Will go ahead and give today since patient is in the office for acute visit.       Dacrocystitis, right - Primary    Erythema  and edema to right upper eye lid with clear drainage from the eye. Recommend warm, moist compress, gentle milking of the lacrimal sac, and close monitoring for signs of worsening inflammation or infection. He does have an eye appointment tomorrow for another purpose, I have encouraged him to keep this appointment to allow the eye doctor the opportunity to evaluate. No signs of infection present at this time, but low threshold for starting antibiotic treatment. He will follow-up in the next 2-3 days if there is no improvement with conservative measures.       Eczematous dermatitis of upper and lower eyelids of both eyes    Upper and lower lid edema, erythema, and scaling consistent with eczematous irritation, likely related to increased irritation from swelling and drainage. Will send prescription for low potency topical steroid to be applied in very thin layer to the eye lid for 10-14 days. Patient instructed to cleanse eyelids with gentle tear free soap and rinse with cool water. No scrubbing. Use fingertips only to clean.       Relevant Medications   betamethasone valerate lotion (VALISONE) 0.1 %   Olopatadine HCl 0.2 % SOLN      Orma Render, DNP, AGNP-c  03/20/2022  6:40 PM    History, Medications, Surgery, SDOH, and Family History reviewed and updated as appropriate.

## 2022-03-21 ENCOUNTER — Other Ambulatory Visit: Payer: BC Managed Care – PPO

## 2022-03-22 ENCOUNTER — Other Ambulatory Visit: Payer: Self-pay | Admitting: Nurse Practitioner

## 2022-03-22 DIAGNOSIS — H01131 Eczematous dermatitis of right upper eyelid: Secondary | ICD-10-CM

## 2022-03-30 ENCOUNTER — Other Ambulatory Visit: Payer: Self-pay | Admitting: Medical

## 2022-03-31 ENCOUNTER — Other Ambulatory Visit: Payer: Self-pay | Admitting: Medical

## 2022-03-31 MED ORDER — METHYLPHENIDATE HCL ER (LA) 30 MG PO CP24: 30.0000 mg | ORAL_CAPSULE | Freq: Every day | ORAL | 0 refills | Status: DC

## 2022-04-04 ENCOUNTER — Encounter: Payer: Self-pay | Admitting: Medical

## 2022-04-04 ENCOUNTER — Ambulatory Visit: Payer: 59 | Admitting: Medical

## 2022-04-04 VITALS — BP 132/82 | HR 75 | Temp 98.3°F | Wt 182.8 lb

## 2022-04-04 DIAGNOSIS — H6690 Otitis media, unspecified, unspecified ear: Secondary | ICD-10-CM

## 2022-04-04 DIAGNOSIS — H60503 Unspecified acute noninfective otitis externa, bilateral: Secondary | ICD-10-CM | POA: Diagnosis not present

## 2022-04-04 DIAGNOSIS — H00015 Hordeolum externum left lower eyelid: Secondary | ICD-10-CM

## 2022-04-04 MED ORDER — POLYMYXIN B-TRIMETHOPRIM 10000-0.1 UNIT/ML-% OP SOLN: 2.0000 [drp] | Freq: Four times a day (QID) | OPHTHALMIC | 0 refills | Status: DC

## 2022-04-04 MED ORDER — OFLOXACIN 0.3 % OT SOLN: 5.0000 [drp] | Freq: Every day | OTIC | 0 refills | Status: DC

## 2022-04-04 MED ORDER — ERYTHROMYCIN BASE 500 MG PO TABS: 500.0000 mg | ORAL_TABLET | Freq: Two times a day (BID) | ORAL | 0 refills | Status: DC

## 2022-04-04 NOTE — Patient Instructions (Signed)
Recommendations: For the eye, use warm moist compression several times daily if possible the next few days, for 15-20 minutes each Begin Polytrim eye drop 2 drops every 6 hours to the left eye  For the ears, begin Ofloxacin ear drops 5 drops once daily Begin oral Emycin twice daily for 10 days for both the ears and the stye If the stye does not improving or resolve within 7-10 days, go back to eye doctor Consider OTC sudafed for decongestant this week given the ear findings Continue good water intake Consider nasal saline flush if you are not doing this If repeat ear or sinus infection over the next 3 months, let me know, as we may need to do other evaluation

## 2022-04-04 NOTE — Progress Notes (Signed)
Subjective:  Jesse Hardin is a 62 y.o. male who presents for Chief Complaint  Patient presents with   OTHER    Lt. Ear painful and feels clogged up, stye on lt. Eye started 2 days ago using hot compress on it,      Here for some concerns.  Having some issues with left ear clogged up possible ear infection, some sinus pressure.  Just had a right ear infection a month ago, including sinus infection.   No runny nose, no sneezing, no fever.   Using some acetaminophen and ibuprofen.  He has a new stye on the left lower eyelid that just popped up a few days ago that is irritated and red.  He has been seen recently here and do tele visit for sinus and ear infection as well as other right-sided clogged duct of the.  He has recently been on Augmentin and doxycycline antibiotic.  No recent fevers, chills, sweats,.  Otherwise in normal state of health.  No other aggravating or relieving factors.    No other c/o.  The following portions of the patient's history were reviewed and updated as appropriate: allergies, current medications, past family history, past medical history, past social history, past surgical history and problem list.  ROS Otherwise as in subjective above  Objective: BP 132/82   Pulse 75   Temp 98.3 F (36.8 C)   Wt 182 lb 12.8 oz (82.9 kg)   BMI 26.99 kg/m   General appearance: alert, no distress, well developed, well nourished HEENT: normocephalic, sclerae anicteric, left lower conjunctiva with erythema and there is a external swelling that is round suggestive of stye with yellowish pus seeming to be fluctuant, bilateral TMs flat with mild erythema, ear canals also appear to be pinkish-red with some flaky debris, somewhat tender on the left ear exam, otherwise nares patent, no discharge or erythema, pharynx normal Oral cavity: MMM, no lesions Neck: supple, no lymphadenopathy, no thyromegaly, no masses    Assessment: Encounter Diagnoses  Name Primary?    Hordeolum externum of left lower eyelid Yes   Acute otitis media, unspecified otitis media type    Acute otitis externa of both ears, unspecified type      Plan: Your symptoms and concerns and other recent infections over the past couple months.  Treatment as below today but we discussed if he gets additional sinus or ear infections in the coming next 3 months then let me know or come back.  We might need to do under evaluation.  It may just be the wintertime of the year and there has been a lot of germs and sickness in the community so it may just be par for the course that he has had several recent infections.   Patient Instructions  Recommendations: For the eye, use warm moist compression several times daily if possible the next few days, for 15-20 minutes each Begin Polytrim eye drop 2 drops every 6 hours to the left eye  For the ears, begin Ofloxacin ear drops 5 drops once daily Begin oral Emycin twice daily for 10 days for both the ears and the stye If the stye does not improving or resolve within 7-10 days, go back to eye doctor Consider OTC sudafed for decongestant this week given the ear findings Continue good water intake Consider nasal saline flush if you are not doing this If repeat ear or sinus infection over the next 3 months, let me know, as we may need to do other evaluation  [place] was seen today for other.  Diagnoses and all orders for this visit:  Hordeolum externum of left lower eyelid  Acute otitis media, unspecified otitis media type  Acute otitis externa of both ears, unspecified type  Other orders -     trimethoprim-polymyxin b (POLYTRIM) ophthalmic solution; Place 2 drops into the left eye every 6 (six) hours. -     ofloxacin (FLOXIN) 0.3 % OTIC solution; Place 5 drops into both ears daily. -     erythromycin base (E-MYCIN) 500 MG tablet; Take 1 tablet (500 mg total) by mouth 2 (two) times daily.    Follow up: prn

## 2022-05-02 ENCOUNTER — Other Ambulatory Visit: Payer: Self-pay | Admitting: Medical

## 2022-05-02 DIAGNOSIS — H6690 Otitis media, unspecified, unspecified ear: Secondary | ICD-10-CM

## 2022-05-02 DIAGNOSIS — H9209 Otalgia, unspecified ear: Secondary | ICD-10-CM

## 2022-05-02 DIAGNOSIS — H938X9 Other specified disorders of ear, unspecified ear: Secondary | ICD-10-CM

## 2022-05-02 MED ORDER — METHYLPHENIDATE HCL ER (LA) 30 MG PO CP24: 30.0000 mg | ORAL_CAPSULE | Freq: Every day | ORAL | 0 refills | Status: DC

## 2022-05-08 ENCOUNTER — Other Ambulatory Visit: Payer: Self-pay | Admitting: Medical

## 2022-05-08 MED ORDER — METHYLPHENIDATE HCL ER (LA) 30 MG PO CP24: 30.0000 mg | ORAL_CAPSULE | Freq: Every day | ORAL | 0 refills | Status: DC

## 2022-05-08 NOTE — Telephone Encounter (Signed)
From: [name] To: Office of Dorothea Ogle, [place] Sent: 05/01/2022 10:29 AM EST Subject: Medication Renewal Request  Refills have been requested for the following medications:   methylphenidate (RITALIN LA) 30 MG 24 hr capsule [Shane Karlo Goeden]  [id number] [place] [address] [place]: PArlyss Gandy

## 2022-05-24 NOTE — Telephone Encounter (Signed)
Dug covered by benefit plan, no P.A. needed

## 2022-06-01 ENCOUNTER — Ambulatory Visit: Payer: 59 | Admitting: Medical

## 2022-06-01 VITALS — HR 84 | Wt 186.6 lb

## 2022-06-01 DIAGNOSIS — R7989 Other specified abnormal findings of blood chemistry: Secondary | ICD-10-CM

## 2022-06-01 DIAGNOSIS — J988 Other specified respiratory disorders: Secondary | ICD-10-CM | POA: Diagnosis not present

## 2022-06-01 DIAGNOSIS — Z114 Encounter for screening for human immunodeficiency virus [HIV]: Secondary | ICD-10-CM

## 2022-06-01 DIAGNOSIS — E559 Vitamin D deficiency, unspecified: Secondary | ICD-10-CM | POA: Diagnosis not present

## 2022-06-01 DIAGNOSIS — L089 Local infection of the skin and subcutaneous tissue, unspecified: Secondary | ICD-10-CM

## 2022-06-01 DIAGNOSIS — Z131 Encounter for screening for diabetes mellitus: Secondary | ICD-10-CM

## 2022-06-01 DIAGNOSIS — Z1159 Encounter for screening for other viral diseases: Secondary | ICD-10-CM

## 2022-06-01 LAB — HEMOGLOBIN A1C
Est. average glucose Bld gHb Est-mCnc: 128 mg/dL
Hgb A1c MFr Bld: 6.1 % — ABNORMAL HIGH (ref 4.8–5.6)

## 2022-06-01 MED ORDER — MUPIROCIN 2 % EX OINT: 1.0000 | TOPICAL_OINTMENT | Freq: Two times a day (BID) | CUTANEOUS | 2 refills | Status: DC

## 2022-06-01 MED ORDER — CEPHALEXIN 500 MG PO CAPS: 500.0000 mg | ORAL_CAPSULE | Freq: Three times a day (TID) | ORAL | 0 refills | Status: DC

## 2022-06-01 NOTE — Progress Notes (Signed)
Subjective:  Jesse Hardin is a 62 y.o. male who presents for Chief Complaint  Patient presents with   enlarged lymph nodes    Enlarged lymph nodes under arms- boil about a week ago.     Here for boil under arm.  Worried about several respiratory infections in past 49mo has some new nodules in armpits.  Sometimes in past year infections didn't clear with certain antibiotics.  Has a new cat but these recent infections including respiratory and nodules in armpits were going on prior to new cat.  Worried about immunity  Here to f/u on abnormal labs from last visit here  Doesn't drink alcohol  No other aggravating or relieving factors.    No other c/o.  Past Medical History:  Diagnosis Date   Allergy    Anxiety    Bipolar disorder ([place]    "quick cycler", hx/o manic episodes if off medication   Colon polyps 06/26/2011   Ascending colon   Depression    Diverticulitis of colon 06/26/2011   per colonoscopy   GERD (gastroesophageal reflux disease)    Grade I internal hemorrhoids 06/26/2011   colonscopy.    HLD (hyperlipidemia)    Insomnia    Migraine    Osteoporosis    Pneumonia    Sexual dysfunction    Current Outpatient Medications on File Prior to Visit  Medication Sig Dispense Refill   B Complex Vitamins ([facility] BALANCED B100) TBCR TAKE 1 TABLET BY MOUTH EVERY DAY 90 tablet 1   b complex vitamins capsule Take 1 capsule by mouth daily. 30 capsule 1   buPROPion (WELLBUTRIN XL) 300 MG 24 hr tablet TAKE 1 TABLET BY MOUTH EVERY DAY 90 tablet 1   Cholecalciferol (VITAMIN D3) 50 MCG (2000 UT) capsule TAKE 1 CAPSULE BY MOUTH EVERY DAY 90 capsule 1   fexofenadine (ALLEGRA) 60 MG tablet Take 60 mg by mouth daily as needed.     fluticasone-salmeterol (ADVAIR) 500-50 MCG/ACT AEPB Inhale 1 puff into the lungs in the morning and at bedtime. 60 each 5   methylphenidate (RITALIN LA) 30 MG 24 hr capsule Take 1 capsule (30 mg total) by mouth daily. 30 capsule 0   methylphenidate  (RITALIN LA) 30 MG 24 hr capsule Take 1 capsule (30 mg total) by mouth daily. 30 capsule 0   pantoprazole (PROTONIX) 40 MG tablet TAKE 1 TABLET BY MOUTH TWICE A DAY 180 tablet 2   rosuvastatin (CRESTOR) 10 MG tablet TAKE 1 TABLET BY MOUTH EVERY DAY 90 tablet 1   Testosterone 20.25 MG/ACT (1.62%) GEL APPLY 3 SQUIRTS ONTO THE SKIN DAILY 75 g 1   No current facility-administered medications on file prior to visit.    The following portions of the patient's history were reviewed and updated as appropriate: allergies, current medications, past family history, past medical history, past social history, past surgical history and problem list.  ROS Otherwise as in subjective above  Objective: Pulse 84   Wt 186 lb 9.6 oz (84.6 kg)   BMI 27.56 kg/m   General appearance: alert, no distress, well developed, well nourished Skin: 3 nodules 3-687mdiameter pink/red tender subcutaneous nodules in right axilla HEENT: normocephalic, sclerae anicteric, conjunctiva pink and moist, TMs pearly, nares patent, no discharge or erythema, pharynx normal Oral cavity: MMM, no lesions Neck: supple, no lymphadenopathy, no thyromegaly, no masses Heart: RRR, normal S1, S2, no murmurs Lungs: CTA bilaterally, no wheezes, rhonchi, or rales Abdomen: +bs, soft, non tender, non distended, no masses, no hepatomegaly, no splenomegaly  Pulses: 2+ radial pulses, 2+ pedal pulses, normal cap refill Ext: no edema   Assessment: Encounter Diagnoses  Name Primary?   Recurrent respiratory infection Yes   Skin infection    Elevated LFTs    Vitamin D deficiency    Need for hepatitis C screening test    Screening for HIV (human immunodeficiency virus)    Screening for diabetes mellitus      Plan: Skin infection right axillar, nodular cysts infected - begin warm compresses, continue benzoyl peroxide, but add mupirocin ointment and oral keflex  Labs today to further eval frequent respiratory infection  Elevated LFTs in fall  2023.  Updated labs today to help evaluate  History of vitamin D deficiency - updated labs today  Screen for diabetes labs today  Jesse Hardin was seen today for enlarged lymph nodes.  Diagnoses and all orders for this visit:  Recurrent respiratory infection -     CBC with Differential/Platelet -     VITAMIN D 25 Hydroxy (Vit-D Deficiency, Fractures) -     HIV Antibody (routine testing w rflx) -     Hemoglobin A1c  Skin infection -     CBC with Differential/Platelet -     VITAMIN D 25 Hydroxy (Vit-D Deficiency, Fractures) -     HIV Antibody (routine testing w rflx) -     Hemoglobin A1c  Elevated LFTs -     HIV Antibody (routine testing w rflx) -     Iron -     Hepatic function panel -     Hepatitis B surface antigen -     Hepatitis C antibody  Vitamin D deficiency -     VITAMIN D 25 Hydroxy (Vit-D Deficiency, Fractures)  Need for hepatitis C screening test -     Hepatitis B surface antigen -     Hepatitis C antibody  Screening for HIV (human immunodeficiency virus) -     HIV Antibody (routine testing w rflx)  Screening for diabetes mellitus -     Hemoglobin A1c  Other orders -     mupirocin ointment (BACTROBAN) 2 %; Apply 1 Application topically 2 (two) times daily. -     cephALEXin (KEFLEX) 500 MG capsule; Take 1 capsule (500 mg total) by mouth 3 (three) times daily.    Follow up: pending labs

## 2022-06-02 LAB — HEPATITIS B SURFACE ANTIGEN: Hepatitis B Surface Ag: NEGATIVE

## 2022-06-02 LAB — CBC WITH DIFFERENTIAL/PLATELET
Basophils Absolute: 0 10*3/uL (ref 0.0–0.2)
Basos: 0 %
EOS (ABSOLUTE): 0.2 10*3/uL (ref 0.0–0.4)
Eos: 3 %
Hematocrit: 42.4 % (ref 37.5–51.0)
Hemoglobin: 13.2 g/dL (ref 13.0–17.7)
Immature Grans (Abs): 0.1 10*3/uL (ref 0.0–0.1)
Immature Granulocytes: 1 %
Lymphocytes Absolute: 3.6 10*3/uL — ABNORMAL HIGH (ref 0.7–3.1)
Lymphs: 43 %
MCH: 25.9 pg — ABNORMAL LOW (ref 26.6–33.0)
MCHC: 31.1 g/dL — ABNORMAL LOW (ref 31.5–35.7)
MCV: 83 fL (ref 79–97)
Monocytes Absolute: 0.9 10*3/uL (ref 0.1–0.9)
Monocytes: 10 %
Neutrophils Absolute: 3.5 10*3/uL (ref 1.4–7.0)
Neutrophils: 43 %
Platelets: 316 10*3/uL (ref 150–450)
RBC: 5.1 x10E6/uL (ref 4.14–5.80)
RDW: 16.1 % — ABNORMAL HIGH (ref 11.6–15.4)
WBC: 8.2 10*3/uL (ref 3.4–10.8)

## 2022-06-02 LAB — IRON: Iron: 44 ug/dL (ref 38–169)

## 2022-06-02 LAB — HEPATIC FUNCTION PANEL
ALT: 55 IU/L — ABNORMAL HIGH (ref 0–44)
AST: 44 IU/L — ABNORMAL HIGH (ref 0–40)
Albumin: 4.4 g/dL (ref 3.9–4.9)
Alkaline Phosphatase: 51 IU/L (ref 44–121)
Bilirubin Total: 0.3 mg/dL (ref 0.0–1.2)
Bilirubin, Direct: <0.1 mg/dL (ref 0.00–0.40)
Total Protein: 6.8 g/dL (ref 6.0–8.5)

## 2022-06-02 LAB — HEPATITIS C ANTIBODY: Hep C Virus Ab: NONREACTIVE

## 2022-06-02 LAB — HIV ANTIBODY (ROUTINE TESTING W REFLEX): HIV Screen 4th Generation wRfx: NONREACTIVE

## 2022-06-02 LAB — VITAMIN D 25 HYDROXY (VIT D DEFICIENCY, FRACTURES): Vit D, 25-Hydroxy: 36.5 ng/mL (ref 30.0–100.0)

## 2022-06-02 NOTE — Progress Notes (Signed)
Results sent through MyChart

## 2022-06-06 ENCOUNTER — Ambulatory Visit: Payer: 59 | Admitting: Medical

## 2022-06-06 ENCOUNTER — Telehealth: Payer: Self-pay | Admitting: Medical

## 2022-06-06 ENCOUNTER — Other Ambulatory Visit: Payer: Self-pay | Admitting: Medical

## 2022-06-06 MED ORDER — METHYLPHENIDATE HCL ER (LA) 30 MG PO CP24: 30.0000 mg | ORAL_CAPSULE | Freq: Every day | ORAL | 0 refills | Status: DC

## 2022-06-06 NOTE — Telephone Encounter (Signed)
[facility]   , he needs rx sent to   [facility]  He verified that they have in stock

## 2022-06-07 ENCOUNTER — Other Ambulatory Visit: Payer: Self-pay | Admitting: Medical

## 2022-06-07 DIAGNOSIS — R7989 Other specified abnormal findings of blood chemistry: Secondary | ICD-10-CM

## 2022-06-13 ENCOUNTER — Other Ambulatory Visit: Payer: Self-pay | Admitting: *Deleted

## 2022-06-13 DIAGNOSIS — R7989 Other specified abnormal findings of blood chemistry: Secondary | ICD-10-CM

## 2022-06-15 ENCOUNTER — Other Ambulatory Visit: Payer: Self-pay | Admitting: Medical

## 2022-06-21 ENCOUNTER — Ambulatory Visit (HOSPITAL_COMMUNITY)
Admission: RE | Admit: 2022-06-21 | Discharge: 2022-06-21 | Disposition: A | Discharge status: Home / Self Care | Payer: 59 | Source: Ambulatory Visit | Attending: Medical | Admitting: Medical

## 2022-06-21 DIAGNOSIS — R7989 Other specified abnormal findings of blood chemistry: Secondary | ICD-10-CM | POA: Insufficient documentation

## 2022-06-21 NOTE — Progress Notes (Signed)
Results sent through MyChart

## 2022-06-24 ENCOUNTER — Other Ambulatory Visit: Payer: Self-pay | Admitting: Medical

## 2022-07-04 ENCOUNTER — Other Ambulatory Visit: Payer: Self-pay | Admitting: Medical

## 2022-07-04 MED ORDER — METHYLPHENIDATE HCL ER (LA) 30 MG PO CP24: 30.0000 mg | ORAL_CAPSULE | Freq: Every day | ORAL | 0 refills | Status: DC

## 2022-07-05 ENCOUNTER — Telehealth: Payer: Self-pay

## 2022-07-05 ENCOUNTER — Other Ambulatory Visit: Payer: Self-pay | Admitting: Medical

## 2022-07-05 MED ORDER — METHYLPHENIDATE HCL ER (LA) 30 MG PO CP24: 30.0000 mg | ORAL_CAPSULE | Freq: Every day | ORAL | 0 refills | Status: DC

## 2022-07-05 NOTE — Telephone Encounter (Signed)
Pt called and advised their pharmacy is out of Ritalin. He stated this med is available at [facility] on [address] if he can have a prescription sent there instead.

## 2022-07-17 ENCOUNTER — Other Ambulatory Visit: Payer: Self-pay | Admitting: Medical

## 2022-07-17 DIAGNOSIS — L989 Disorder of the skin and subcutaneous tissue, unspecified: Secondary | ICD-10-CM

## 2022-07-17 MED ORDER — DOXYCYCLINE MONOHYDRATE 100 MG PO TABS: 100.0000 mg | ORAL_TABLET | Freq: Every day | ORAL | 0 refills | Status: DC

## 2022-07-20 ENCOUNTER — Telehealth: Payer: Self-pay | Admitting: Family Medicine

## 2022-07-20 NOTE — Telephone Encounter (Signed)
[place] APPROVED Methalphenidate until 07/20/23.  I will sent my chart message to pt.

## 2022-07-20 NOTE — Telephone Encounter (Signed)
PA sent thru cover my meds for Methyphenidate await response

## 2022-08-04 ENCOUNTER — Telehealth: Payer: Self-pay | Admitting: Medical

## 2022-08-04 ENCOUNTER — Other Ambulatory Visit: Payer: Self-pay | Admitting: Medical

## 2022-08-04 MED ORDER — METHYLPHENIDATE HCL ER (LA) 30 MG PO CP24: 30.0000 mg | ORAL_CAPSULE | Freq: Every day | ORAL | 0 refills | Status: DC

## 2022-08-04 NOTE — Telephone Encounter (Signed)
Tj requesting a refill on ritalin to [id number] - [place], [place] - [address]

## 2022-08-14 ENCOUNTER — Other Ambulatory Visit: Payer: Self-pay | Admitting: Medical

## 2022-08-14 MED ORDER — TESTOSTERONE 20.25 MG/ACT (1.62%) TD GEL: TRANSDERMAL | 2 refills | Status: DC

## 2022-08-19 ENCOUNTER — Other Ambulatory Visit: Payer: Self-pay | Admitting: Medical

## 2022-08-29 ENCOUNTER — Encounter: Payer: Self-pay | Admitting: Medical

## 2022-08-29 ENCOUNTER — Ambulatory Visit: Payer: 59 | Admitting: Medical

## 2022-08-29 VITALS — BP 130/80 | HR 90 | Ht 69.0 in | Wt 189.0 lb

## 2022-08-29 DIAGNOSIS — M549 Dorsalgia, unspecified: Secondary | ICD-10-CM | POA: Diagnosis not present

## 2022-08-29 DIAGNOSIS — M542 Cervicalgia: Secondary | ICD-10-CM

## 2022-08-29 DIAGNOSIS — R29898 Other symptoms and signs involving the musculoskeletal system: Secondary | ICD-10-CM

## 2022-08-29 DIAGNOSIS — M62830 Muscle spasm of back: Secondary | ICD-10-CM

## 2022-08-29 MED ORDER — HYDROCODONE-ACETAMINOPHEN 10-325 MG PO TABS: 1.0000 | ORAL_TABLET | Freq: Two times a day (BID) | ORAL | 0 refills | Status: AC | PRN

## 2022-08-29 MED ORDER — DIAZEPAM 5 MG PO TABS: 5.0000 mg | ORAL_TABLET | Freq: Two times a day (BID) | ORAL | 0 refills | Status: DC | PRN

## 2022-08-29 NOTE — Patient Instructions (Signed)
Neck and back pain, arm pain, spasm  Recommendations: Use over the counter Aleve, 2 tablets twice daily this week Use Valium 5mg  twice daily the next few days instead of flexeril to see if that helps the tension a little better You can use the pain medication sent to pharmacy for worse pain short term, up toe 2 times daily Go get a massage Do some gentle stretching and neck and shoulder range of motion exercise If worse or not improving in the next 2 weeks, then recheck If much worse in the next 7 days such as several pain not letting up, then call right away   Massage therapist, Sharen Hint, [facility] [phone]

## 2022-08-29 NOTE — Progress Notes (Signed)
Subjective:  Jesse Hardin is a 62 y.o. male who presents for Chief Complaint  Patient presents with   Shoulder Pain    Left neck/shoulder pain that radiates down his left arm and makes his fingers tingle. Started about 3-4 weeks ago and worsened in the last week. Went to [facility] Sunday.      A month ago had stiff neck at left base of neck.  Stays stiff ongoing.  Then 2 weeks ago started getting pain in upper left back, top half of arm, intermittent, and has some intermittent tingling in left hand and arm.     At times pain 5/10, but sometimes 10/10 to the point of tears.  Pain can include left neck, upper back, and mostly upper arms.  Can get tingling in all fingers but sometimes just left middle finger.  Does feel some weakness in left arm.  No right arm issues.  No recent injury, fall or trauma.     Went to urgent care this past Sunday 2 days ago, was given flexeril, ibuprofen 800mg .  Doesn't feel like ibuprofen helps so been taking aleve in stead.  Flexeril maybe helped a little 2 days, but not since then.  Been using flexeril TID.   No other aggravating or relieving factors.    No other c/o.  The following portions of the patient's history were reviewed and updated as appropriate: allergies, current medications, past family history, past medical history, past social history, past surgical history and problem list.  ROS Otherwise as in subjective above  Objective: BP 130/80   Pulse 90   Ht 5\' 9"  (1.753 m)   Wt 189 lb (85.7 kg)   SpO2 98%   BMI 27.91 kg/m   General appearance: alert, no distress, well developed, well nourished Neck ROM reduced to left , otherwise relatively full, otherwise supple, no lymphadenopathy, no thyromegaly, no masses Tender and +spasm left upper back paraspinal and supraspinatus, otherwise nontender MSK: left arm nontender with normal ROM, no swelling Strength and sensation of arms normal Pulses: 2+ radial pulses, 2+ pedal pulses, normal cap  refill Ext: no edema   Assessment: Encounter Diagnoses  Name Primary?   Neck pain Yes   Decreased ROM of neck    Upper back pain    Back spasm      Plan: Discussed symptoms and concerns.   Stop ibuprofen and flexeril since he doesn't feel great improvement.  Of note he had an IM Decadron at urgent care for the same 2 days ago.   Use trial of valium for muscles spasm, continue aleve OTC since that is helping, norco below for breakthrough pain.  Continues with rest and stretching.  Advised he do massage therapy this week if possible.   If symptom not resolving within the next 2 weeks or if worse in the short term, then recheck or call back.    Otherwise f/u 2 weeks   [name] was seen today for shoulder pain.  Diagnoses and all orders for this visit:  Neck pain  Decreased ROM of neck  Upper back pain  Back spasm  Other orders -     diazepam (VALIUM) 5 MG tablet; Take 1 tablet (5 mg total) by mouth every 12 (twelve) hours as needed for anxiety. -     HYDROcodone-acetaminophen (NORCO) 10-325 MG tablet; Take 1 tablet by mouth 2 (two) times daily as needed for up to 5 days.   Follow up: prn

## 2022-08-30 ENCOUNTER — Ambulatory Visit: Payer: 59 | Admitting: Medical

## 2022-09-04 ENCOUNTER — Other Ambulatory Visit: Payer: Self-pay | Admitting: Medical

## 2022-09-04 MED ORDER — METHYLPHENIDATE HCL ER (LA) 30 MG PO CP24: 30.0000 mg | ORAL_CAPSULE | Freq: Every day | ORAL | 0 refills | Status: DC

## 2022-09-12 ENCOUNTER — Telehealth: Payer: Self-pay | Admitting: Medical

## 2022-09-12 ENCOUNTER — Other Ambulatory Visit: Payer: Self-pay | Admitting: Medical

## 2022-09-12 NOTE — Telephone Encounter (Signed)
Pt called concerning Prolia. Pt advised that estimated cost is 515.46 and PA was approved and on file. Medication was ordered from physician services. Pt advised to call when leaving so medication can be set out and pt verbalized understanding. PA # [id number]

## 2022-09-18 ENCOUNTER — Other Ambulatory Visit: Payer: 59

## 2022-09-21 ENCOUNTER — Other Ambulatory Visit: Payer: 59

## 2022-09-21 DIAGNOSIS — M810 Age-related osteoporosis without current pathological fracture: Secondary | ICD-10-CM | POA: Diagnosis not present

## 2022-09-21 MED ORDER — DENOSUMAB 60 MG/ML ~~LOC~~ SOSY
60.0000 mg | PREFILLED_SYRINGE | Freq: Once | SUBCUTANEOUS | Status: AC
  Administered 2022-09-21: 60 mg via SUBCUTANEOUS

## 2022-09-25 ENCOUNTER — Ambulatory Visit: Payer: 59 | Admitting: Medical

## 2022-09-25 ENCOUNTER — Encounter: Payer: Self-pay | Admitting: Medical

## 2022-09-25 VITALS — BP 148/94 | HR 77 | Temp 97.7°F | Resp 18 | Wt 183.0 lb

## 2022-09-25 DIAGNOSIS — Z7952 Long term (current) use of systemic steroids: Secondary | ICD-10-CM | POA: Diagnosis not present

## 2022-09-25 DIAGNOSIS — R030 Elevated blood-pressure reading, without diagnosis of hypertension: Secondary | ICD-10-CM | POA: Diagnosis not present

## 2022-09-25 DIAGNOSIS — L732 Hidradenitis suppurativa: Secondary | ICD-10-CM

## 2022-09-25 DIAGNOSIS — Z79899 Other long term (current) drug therapy: Secondary | ICD-10-CM | POA: Diagnosis not present

## 2022-09-25 MED ORDER — CHLORHEXIDINE GLUCONATE 4 % EX SOLN: Freq: Every day | CUTANEOUS | 1 refills | Status: DC | PRN

## 2022-09-25 MED ORDER — CEPHALEXIN 500 MG PO CAPS: 500.0000 mg | ORAL_CAPSULE | Freq: Three times a day (TID) | ORAL | 0 refills | Status: DC

## 2022-09-25 NOTE — Progress Notes (Signed)
Subjective:  Jesse Hardin is a 62 y.o. male who presents for Chief Complaint  Patient presents with   Mass    Patient complains of having a lump under his right axilla. Noticed the lump 1 week ago. He states the lump is painful and has increased in size. He reports having similar lumps underneath his arms several months ago.      Here for cystic red lump in the right armpit.  He had similar back in February 2024.  Over the last few months he has been on topical steroids per dermatology for some ear and eyelid issues.  He notes that anytime he is using those he seems to get a bump like this in his armpit.  No drainage but it is tender.  He did not have this present when he saw dermatology recently.  Otherwise normal state of health without complaint  No other aggravating or relieving factors.    No other c/o.  The following portions of the patient's history were reviewed and updated as appropriate: allergies, current medications, past family history, past medical history, past social history, past surgical history and problem list.  ROS Otherwise as in subjective above  Objective: BP (!) 148/94 (BP Location: Right Arm, Cuff Size: Normal)   Pulse 77   Temp 97.7 F (36.5 C) (Oral)   Resp 18   Wt 183 lb (83 kg)   SpO2 95% Comment: room air  BMI 27.02 kg/m   General appearance: alert, no distress, well developed, well nourished Skin: Right axilla with a swollen 1.3 cm raised erythematous subcutaneous lump with some slight induration.  No fluctuance.   Assessment: Encounter Diagnoses  Name Primary?   Hidradenitis axillaris Yes   Elevated blood pressure reading in office without diagnosis of hypertension    High risk medication use    Current use of steroid medication      Plan: Hidradenitis axillaris-discussed possible contributing factors.  He had similar back in February 2024.  At that time he was prediabetic and he has been using both inhaled steroid Advair as well  as topical steroids in recent months.  We discussed that these could be playing a role.  Begin Hibiclens and Keflex.  He will use topical benzyl peroxide.  If this continues to be an ongoing issue over the next few months he may need to see dermatology again  Elevated blood pressure-advise home monitoring, and if blood pressures continue to stay over 130/80 we might need to lower his methylphenidate dose or use other measures to reduce blood pressure.  Counseled on diet and exercise.  He knows there are some things he can do differently with his diet currently.  High risk medication use-going forward we might need to lower the dosage of his stimulant medicine for ADD in light of the blood pressure reading today   Jesse Hardin was seen today for mass.  Diagnoses and all orders for this visit:  Hidradenitis axillaris  Elevated blood pressure reading in office without diagnosis of hypertension  High risk medication use  Current use of steroid medication  Other orders -     cephALEXin (KEFLEX) 500 MG capsule; Take 1 capsule (500 mg total) by mouth 3 (three) times daily. -     chlorhexidine (HIBICLENS) 4 % external liquid; Apply topically daily as needed.    Follow up: 19mo

## 2022-10-04 ENCOUNTER — Encounter: Payer: Self-pay | Admitting: Medical

## 2022-10-04 ENCOUNTER — Ambulatory Visit: Payer: 59 | Admitting: Medical

## 2022-10-04 ENCOUNTER — Ambulatory Visit
Admission: RE | Admit: 2022-10-04 | Discharge: 2022-10-04 | Disposition: A | Discharge status: Home / Self Care | Payer: 59 | Source: Ambulatory Visit | Attending: Medical | Admitting: Medical

## 2022-10-04 ENCOUNTER — Other Ambulatory Visit: Payer: Self-pay | Admitting: Medical

## 2022-10-04 VITALS — BP 180/102 | HR 75 | Temp 97.8°F | Resp 18 | Wt 182.0 lb

## 2022-10-04 DIAGNOSIS — R29898 Other symptoms and signs involving the musculoskeletal system: Secondary | ICD-10-CM | POA: Diagnosis not present

## 2022-10-04 DIAGNOSIS — M542 Cervicalgia: Secondary | ICD-10-CM

## 2022-10-04 DIAGNOSIS — M792 Neuralgia and neuritis, unspecified: Secondary | ICD-10-CM

## 2022-10-04 DIAGNOSIS — R030 Elevated blood-pressure reading, without diagnosis of hypertension: Secondary | ICD-10-CM | POA: Insufficient documentation

## 2022-10-04 DIAGNOSIS — M549 Dorsalgia, unspecified: Secondary | ICD-10-CM

## 2022-10-04 MED ORDER — HYDROCODONE-ACETAMINOPHEN 5-325 MG PO TABS: 1.0000 | ORAL_TABLET | Freq: Two times a day (BID) | ORAL | 0 refills | Status: DC | PRN

## 2022-10-04 NOTE — Patient Instructions (Signed)
Recommendations: Use Aleve over-the-counter 1 or 2 tablets once or twice a day for the next week for pain and inflammation You can use the hydrocodone for worse pain or breakthrough pain up to twice a day but caution on sedation.  This is not meant to be a long-term medication Begin the Valium I prescribed back in May.  You can use this once or twice a day for the next few days for spasm and tension in the neck and back Rest and do some gentle stretching of the neck several days I am placing referral to physical therapy so expect a phone call about scheduling Please go to [facility] for your neck xray.      [place] [facility] Hours are 8am - 4:30 pm Monday - Friday.  Take your insurance card with you.  [facility] [phone]  [address] [place], [place] [address]

## 2022-10-04 NOTE — Progress Notes (Signed)
Subjective:  Jesse Hardin is a 62 y.o. male who presents for Chief Complaint  Patient presents with   Neck Pain    Complains of having a dull pain at the back of the left side of his neck. Pain is constant and radiates down his left arm. He was last seen for this problem on 08/29/2022. Patient feels that the pain is slightly worse since last visit. Has tried using Hydrocodone and Aleve. Pain resolves when laying down and turning his head to the right.  .     Here for pain.  He notes some ongoing pain in his left neck arm and back.  He had similar issue in May he was seen at urgent care and subsequently here.  Since then, pain.  He has pain going down the back of the left side of his neck upper back and arm all the way down to the hand including tingling in the arm.  No injury or trauma or recent strenuous activity to cause the pain.  This is similar to what he felt in May where he was seen by urgent care.  He has been using some leftover hydrocodone and Aleve.  It helps some.  But just not improving.  Regarding elevated blood pressure today, no history of hypertension, no prior blood pressure medications.  No other aggravating or relieving factors.    No other c/o.  Past Medical History:  Diagnosis Date   Allergy    Anxiety    Bipolar disorder (HCC)    "quick cycler", hx/o manic episodes if off medication   Colon polyps 06/26/2011   Ascending colon   Depression    Diverticulitis of colon 06/26/2011   per colonoscopy   GERD (gastroesophageal reflux disease)    Grade I internal hemorrhoids 06/26/2011   colonscopy.    HLD (hyperlipidemia)    Insomnia    Migraine    Osteoporosis    Pneumonia    Sexual dysfunction    Current Outpatient Medications on File Prior to Visit  Medication Sig Dispense Refill   b complex vitamins capsule Take 1 capsule by mouth daily. 30 capsule 1   buPROPion (WELLBUTRIN XL) 300 MG 24 hr tablet TAKE 1 TABLET BY MOUTH EVERY DAY 90 tablet 1    cephALEXin (KEFLEX) 500 MG capsule Take 1 capsule (500 mg total) by mouth 3 (three) times daily. 30 capsule 0   chlorhexidine (HIBICLENS) 4 % external liquid Apply topically daily as needed. 946 mL 1   fluticasone-salmeterol (ADVAIR) 500-50 MCG/ACT AEPB Inhale 1 puff into the lungs in the morning and at bedtime. 60 each 5   methylphenidate (RITALIN LA) 30 MG 24 hr capsule Take 1 capsule (30 mg total) by mouth daily. 30 capsule 0   naproxen sodium (ALEVE) 220 MG tablet Take 220 mg by mouth as needed.     pantoprazole (PROTONIX) 40 MG tablet TAKE 1 TABLET BY MOUTH TWICE A DAY 180 tablet 2   rosuvastatin (CRESTOR) 10 MG tablet TAKE 1 TABLET BY MOUTH EVERY DAY 90 tablet 0   Testosterone 20.25 MG/ACT (1.62%) GEL APPLY 3 SQUIRTS ONTO THE SKIN DAILY 75 g 2   VITAMIN D3 50 MCG (2000 UT) capsule TAKE 1 CAPSULE BY MOUTH EVERY DAY 90 capsule 1   No current facility-administered medications on file prior to visit.     The following portions of the patient's history were reviewed and updated as appropriate: allergies, current medications, past family history, past medical history, past social history, past surgical history  and problem list.  ROS Otherwise as in subjective above    Objective: BP (!) 180/102 (BP Location: Left Arm, Cuff Size: Normal)   Pulse 75   Temp 97.8 F (36.6 C) (Oral)   Resp 18   Wt 182 lb (82.6 kg)   SpO2 95% Comment: room air  BMI 26.88 kg/m   General appearance: alert, no distress, well developed, well nourished Somewhat decreased range of motion of the neck to the left, otherwise range of motion normal, nontender neck, no masses lymphadenopathy or thyromegaly Upper back nontender Mild tenderness in the posterior deltoid area but otherwise shoulder and arm nontender with normal range of motion. Positive Tinel's on the left but otherwise arms neurovascularly intact    Assessment: Encounter Diagnoses  Name Primary?   Neck pain Yes   Decreased ROM of neck     Radicular pain in left arm    Upper back pain    Elevated BP without diagnosis of hypertension      Plan: We discussed his symptoms and concerns.  Regarding blood pressure, he has been in pain the last 2 visits.  He does not have a history of hypertension.  We will continue to monitor his blood pressure.  Given his radicular pain in his arm and left neck and upper back we will make referral to physical therapy and he will begin x-ray baseline of the cervical spine.  We discussed recommendations below.  Patient Instructions  Recommendations: Use Aleve over-the-counter 1 or 2 tablets once or twice a day for the next week for pain and inflammation You can use the hydrocodone for worse pain or breakthrough pain up to twice a day but caution on sedation.  This is not meant to be a long-term medication Begin the Valium I prescribed back in May.  You can use this once or twice a day for the next few days for spasm and tension in the neck and back Rest and do some gentle stretching of the neck several days I am placing referral to physical therapy so expect a phone call about scheduling Please go to [facility] for your neck xray.       Imaging Hours are 8am - 4:30 pm Monday - Friday.  Take your insurance card with you.  [facility] [phone]  [address] [place], [place] [address]      Jesse Hardin was seen today for neck pain.  Diagnoses and all orders for this visit:  Neck pain -     DG Cervical Spine Complete; Future  Decreased ROM of neck -     DG Cervical Spine Complete; Future  Radicular pain in left arm -     DG Cervical Spine Complete; Future  Upper back pain -     DG Cervical Spine Complete; Future  Elevated BP without diagnosis of hypertension  Other orders -     HYDROcodone-acetaminophen (NORCO) 5-325 MG tablet; Take 1 tablet by mouth 2 (two) times daily as needed.    Follow up: pending xray

## 2022-10-04 NOTE — Addendum Note (Signed)
Addended by: Benjiman Core on: 10/04/2022 03:26 PM   Modules accepted: Orders

## 2022-10-12 NOTE — Progress Notes (Signed)
Make sure we have placed a physical therapy referral.  I think this is already been done but just double check

## 2022-10-12 NOTE — Progress Notes (Signed)
PT has already been put in

## 2022-10-14 IMAGING — CR DG CHEST 2V
2 series · 2 of 2 positions shown · non-contrast
Comparison: Shoulder radiograph dated September 17, 2018

CLINICAL DATA: chronic cough

EXAM:
CHEST - 2 VIEW

[w chest pa]
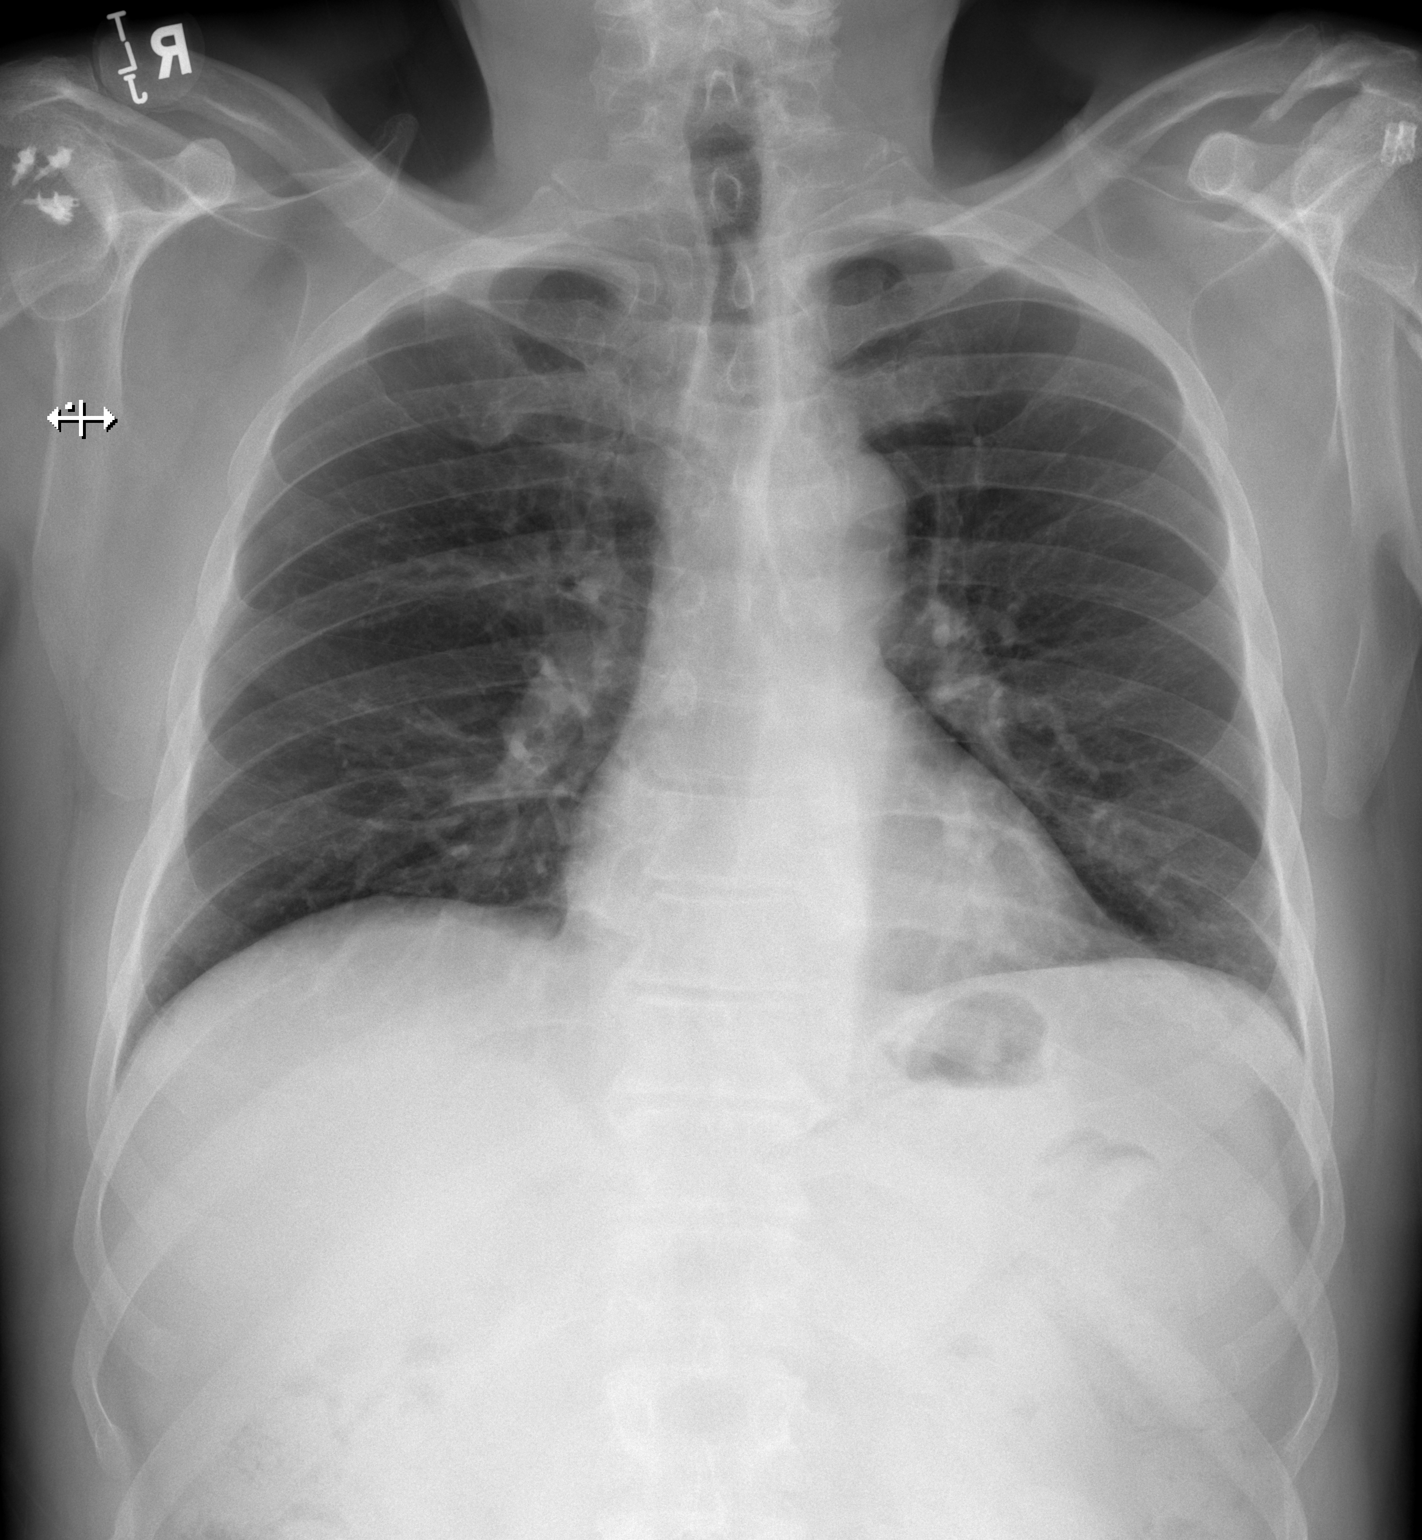

[w chest lat]
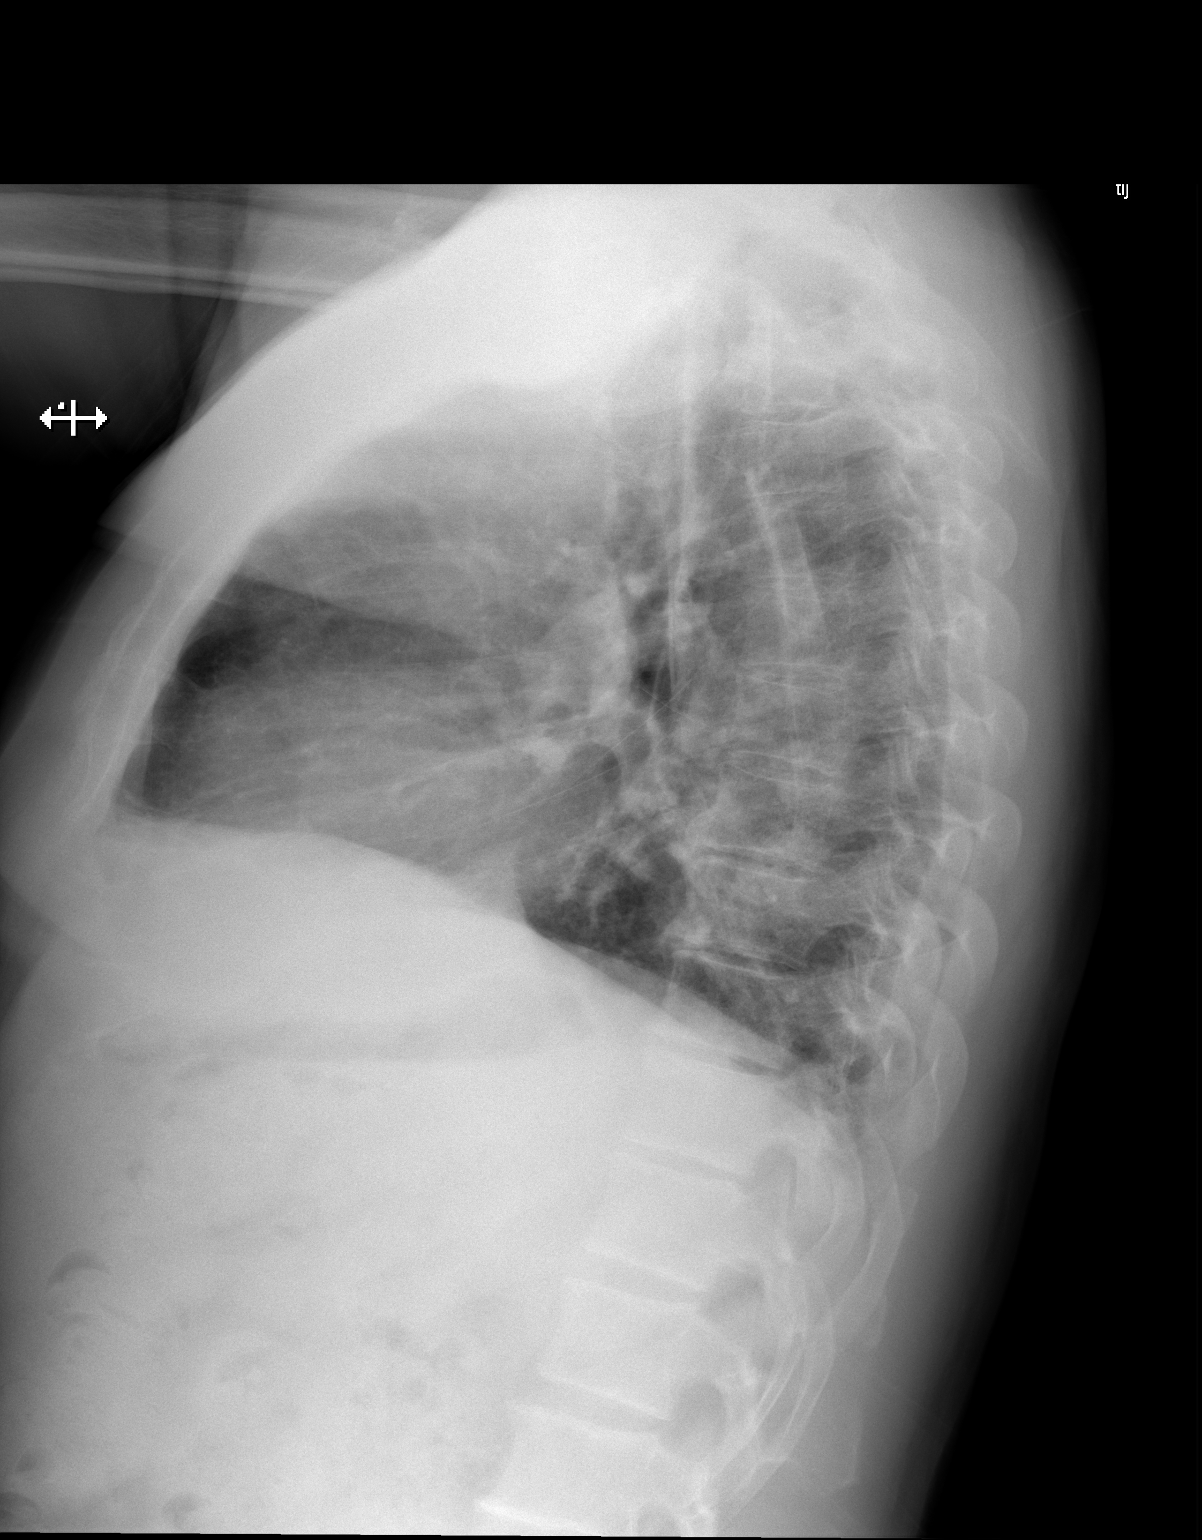

[2 of 2 positions shown; findings below may reference images not displayed]

FINDINGS: The cardiomediastinal silhouette is normal in contour. No pleural
effusion. No pneumothorax. No acute pleuroparenchymal abnormality.
Visualized abdomen is unremarkable. Status post RIGHT shoulder
rotator cuff repair. Mild degenerative changes of the thoracic
spine.
IMPRESSION: No acute cardiopulmonary abnormality.

## 2022-11-17 ENCOUNTER — Encounter (HOSPITAL_BASED_OUTPATIENT_CLINIC_OR_DEPARTMENT_OTHER): Payer: Self-pay | Admitting: Pulmonary Disease

## 2022-11-17 ENCOUNTER — Ambulatory Visit (HOSPITAL_BASED_OUTPATIENT_CLINIC_OR_DEPARTMENT_OTHER): Payer: 59 | Admitting: Pulmonary Disease

## 2022-11-17 VITALS — BP 106/64 | HR 83 | Wt 185.2 lb

## 2022-11-17 DIAGNOSIS — J4550 Severe persistent asthma, uncomplicated: Secondary | ICD-10-CM

## 2022-11-17 NOTE — Patient Instructions (Signed)
  Severe persistent asthma - well-controlled --CONTINUE Advair 500-50 mcg AS NEEDED with ONE puff in the morning and evening. Rinse mouth out after use. --Ok to decrease to once a day during non-allergy seasons  Hx esophageal dilation, hiatal hernia --CONTINUE protonix 40 mg twice daily

## 2022-11-17 NOTE — Progress Notes (Signed)
Subjective:   PATIENT ID: Jesse Hardin GENDER: male DOB: 07/04/1960, MRN: [id number]   HPI  Chief Complaint  Patient presents with   Cough   Asthma    Reason for Visit: Follow-up  Jesse Hardin is a 62 year old male with GERD, hiatal hernia, s/p esophageal dilation x 2 in 2022 who presents for follow-up asthma  Initial consult He reports allergies for month that will trigger his cough however this year he has had a cough for 6 months with no trigger that has persisted. He will have nonproductive coughing spells that are severe and can last 10-15 min. Reports wheezing 1-2 x week during the daytime. Has coughing that will awaken him. Treated with Tussionex with improvement in symptoms and has allowed him to sleep. Allegra and zyrtec was not effective. Denies nasal congestion. On protonix 40 mg twice daily  12/27/21 He reports compliance with Breo 200 daily. Not effective. Maybe some benefit in cough in the mornings but worsening symptoms during the afternoon. Not able to get the breaths that he needs. He is no longer wheezing. He wakes up nightly with coughing spells that will lead to gagging. Mucinex is ineffective.  02/27/22 Since our last visit he was transitioned from [facility] to Advair 500. He was recently seen on 11/71/23 by NP Jesse Hardin in [facility] for sinus pressure and nasal congestion and treated with acute bacterial sinusitis with augmentin and tessalon perles. Prior to illness he reports near resolution of cough. Medications has helped as well as isolation at his workplace with separate office with door. No shortness of breath or cough.  11/17/22 Since our last visit he is no longer taking Advair. Using it once every 2 weeks he will need it for 2-3 days. He now works in a separate office with an air filter and felt this has made the biggest impact. Fall and winter is when his worst times for him.       No data to display          Social History: Never  smoker   Past Medical History:  Diagnosis Date   Allergy    Anxiety    Bipolar disorder (HCC)    "quick cycler", hx/o manic episodes if off medication   Colon polyps 06/26/2011   Ascending colon   Depression    Diverticulitis of colon 06/26/2011   per colonoscopy   GERD (gastroesophageal reflux disease)    Grade I internal hemorrhoids 06/26/2011   colonscopy.    HLD (hyperlipidemia)    Insomnia    Migraine    Osteoporosis    Pneumonia    Sexual dysfunction      Family History  Problem Relation Age of Onset   Depression Mother    Heart disease Father 66       CABG   Depression Sister    Depression Sister    Cancer Sister        breast lesions   Cancer Paternal Grandmother        type unknown, ? lung, heavy smoker   Stroke Neg Hx    Diabetes Neg Hx    Colon cancer Neg Hx    Rectal cancer Neg Hx    Stomach cancer Neg Hx      Social History   Occupational History   Occupation: Chiropractor  Tobacco Use   Smoking status: Never   Smokeless tobacco: Never  Vaping Use   Vaping status: Never Used  Substance and Sexual  Activity   Alcohol use: Yes    Alcohol/week: 0.0 standard drinks of alcohol    Comment: 1-2 per year   Drug use: No   Sexual activity: Not on file    No Known Allergies   Outpatient Medications Prior to Visit  Medication Sig Dispense Refill   b complex vitamins capsule Take 1 capsule by mouth daily. 30 capsule 1   buPROPion (WELLBUTRIN XL) 300 MG 24 hr tablet TAKE 1 TABLET BY MOUTH EVERY DAY 90 tablet 1   Cetirizine HCl (ZYRTEC PO) Take by mouth. Alternating every other day with allegra     FEXOFENADINE HCL PO Take by mouth. Every other day     fluticasone-salmeterol (ADVAIR) 500-50 MCG/ACT AEPB Inhale 1 puff into the lungs in the morning and at bedtime. 60 each 5   methylphenidate (RITALIN LA) 30 MG 24 hr capsule TAKE ONE CAPSULE BY MOUTH ONCE A DAY 30 capsule 0   pantoprazole (PROTONIX) 40 MG tablet Take 40 mg by mouth daily.      rosuvastatin (CRESTOR) 10 MG tablet TAKE 1 TABLET BY MOUTH EVERY DAY 90 tablet 0   Testosterone 20.25 MG/ACT (1.62%) GEL APPLY 3 SQUIRTS ONTO THE SKIN DAILY 75 g 2   VITAMIN D3 50 MCG (2000 UT) capsule TAKE 1 CAPSULE BY MOUTH EVERY DAY 90 capsule 1   chlorhexidine (HIBICLENS) 4 % external liquid Apply topically daily as needed. 946 mL 1   naproxen sodium (ALEVE) 220 MG tablet Take 220 mg by mouth as needed. (Patient not taking: Reported on 11/17/2022)     cephALEXin (KEFLEX) 500 MG capsule Take 1 capsule (500 mg total) by mouth 3 (three) times daily. 30 capsule 0   HYDROcodone-acetaminophen (NORCO) 5-325 MG tablet Take 1 tablet by mouth 2 (two) times daily as needed. 20 tablet 0   pantoprazole (PROTONIX) 40 MG tablet TAKE 1 TABLET BY MOUTH TWICE A DAY 180 tablet 2   No facility-administered medications prior to visit.    Review of Systems  Constitutional:  Negative for chills, diaphoresis, fever, malaise/fatigue and weight loss.  HENT:  Negative for congestion.   Respiratory:  Negative for cough, hemoptysis, sputum production, shortness of breath and wheezing.   Cardiovascular:  Negative for chest pain, palpitations and leg swelling.     Objective:   Vitals:   11/17/22 1315  BP: 106/64  Pulse: 83  SpO2: 98%  Weight: 185 lb 3 oz (84 kg)   SpO2: 98 %  Physical Exam: General: Well-appearing, no acute distress HENT: [place], AT Eyes: EOMI, no scleral icterus Respiratory: Clear to auscultation bilaterally.  No crackles, wheezing or rales Cardiovascular: RRR, -M/R/G, no JVD Extremities:-Edema,-tenderness Neuro: AAO x4, CNII-XII grossly intact Psych: Normal mood, normal affect  Data Reviewed:  Imaging: CXR 08/15/21 - Normal. No infiltrate, effusion or edema  PFT: 09/23/21 FVC 3.6 (81%) FEV1 2.7 (80%) Ratio 76   Interpretation: Normal spirometry  11/18/21 Methacholine challenge positive for hyperreactive airways with FEV1 reduction by 26% at 16 mg/ml.  Labs: CBC    Component  Value Date/Time   WBC 8.2 06/01/2022 0948   WBC 6.2 01/26/2017 0845   RBC 5.10 06/01/2022 0948   RBC 4.94 01/26/2017 0845   HGB 13.2 06/01/2022 0948   HCT 42.4 06/01/2022 0948   PLT 316 06/01/2022 0948   MCV 83 06/01/2022 0948   MCH 25.9 (L) 06/01/2022 0948   MCH 29.6 01/26/2017 0845   MCHC 31.1 (L) 06/01/2022 0948   MCHC 34.2 01/26/2017 0845   RDW 16.1 (  H) 06/01/2022 0948   LYMPHSABS 3.6 (H) 06/01/2022 0948   EOSABS 0.2 06/01/2022 0948   BASOSABS 0.0 06/01/2022 0948   Absolute eos 09/03/18 - 100     Assessment & Plan:   Discussion: 62 year old male with GERD, hiatal hernia s/p esophageal dilation x 2 in 2002 who presents for follow-up. Currently off and doing well but expecting worsening symptoms this fall/winter. Medications has helped as well as isolation at his workplace with separate office with door. Discussed clinical course and management of asthma including bronchodilator regimen and action plan for exacerbation including increased his PRN ICS/LABA to BID for persistent symptoms.  Severe persistent asthma - well-controlled --CONTINUE Advair 500-50 mcg AS NEEDED with ONE puff in the morning and evening. Rinse mouth out after use. --Ok to decrease to once a day during non-allergy seasons  Hx esophageal dilation, hiatal hernia --CONTINUE protonix 40 mg twice daily  Health Maintenance Immunization History  Administered Date(s) Administered   Influenza Inj Mdck Quad Pf 01/21/2017   Influenza Split 02/15/2009   Influenza, Seasonal, Injecte, Preservative Fre 12/01/2013, 02/16/2015   Influenza,inj,Quad PF,6+ Mos 02/19/2018, 01/04/2021, 12/08/2021   Influenza-Unspecified 12/08/2017, 01/08/2019, 02/07/2020   Janssen (J&J) SARS-COV-2 Vaccination 06/13/2019   PFIZER(Purple Top)SARS-COV-2 Vaccination 01/26/2020   Pfizer Covid-19 Vaccine Bivalent Booster 37yrs & up 01/04/2021   Pneumococcal Conjugate-13 12/08/2021   Tdap 04/07/2013   Unspecified SARS-COV-2 Vaccination  01/07/2022   CT Lung Screen - never smoker  No orders of the defined types were placed in this encounter.  No orders of the defined types were placed in this encounter.   No follow-ups on file.  I have spent a total time of 25-minutes on the day of the appointment including chart review, data review, collecting history, coordinating care and discussing medical diagnosis and plan with the patient/family. Past medical history, allergies, medications were reviewed. Pertinent imaging, labs and tests included in this note have been reviewed and interpreted independently by me.  Sherri Levenhagen Mechele Collin, MD [facility] 11/17/2022 1:24 PM  Office Number [phone]
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
## 2022-11-20 ENCOUNTER — Other Ambulatory Visit: Payer: Self-pay

## 2022-11-20 ENCOUNTER — Encounter (HOSPITAL_BASED_OUTPATIENT_CLINIC_OR_DEPARTMENT_OTHER): Payer: Self-pay | Admitting: Physical Therapy

## 2022-11-20 ENCOUNTER — Ambulatory Visit (HOSPITAL_BASED_OUTPATIENT_CLINIC_OR_DEPARTMENT_OTHER): Payer: 59 | Attending: Medical | Admitting: Physical Therapy

## 2022-11-20 DIAGNOSIS — M792 Neuralgia and neuritis, unspecified: Secondary | ICD-10-CM | POA: Diagnosis not present

## 2022-11-20 DIAGNOSIS — M542 Cervicalgia: Secondary | ICD-10-CM | POA: Insufficient documentation

## 2022-11-20 DIAGNOSIS — M549 Dorsalgia, unspecified: Secondary | ICD-10-CM | POA: Diagnosis not present

## 2022-11-20 DIAGNOSIS — M5412 Radiculopathy, cervical region: Secondary | ICD-10-CM | POA: Diagnosis present

## 2022-11-20 DIAGNOSIS — R29898 Other symptoms and signs involving the musculoskeletal system: Secondary | ICD-10-CM | POA: Diagnosis not present

## 2022-11-21 ENCOUNTER — Ambulatory Visit (HOSPITAL_BASED_OUTPATIENT_CLINIC_OR_DEPARTMENT_OTHER): Payer: 59 | Admitting: Physical Therapy

## 2022-11-21 DIAGNOSIS — M542 Cervicalgia: Secondary | ICD-10-CM | POA: Diagnosis not present

## 2022-11-21 DIAGNOSIS — M5412 Radiculopathy, cervical region: Secondary | ICD-10-CM

## 2022-11-22 ENCOUNTER — Encounter (HOSPITAL_BASED_OUTPATIENT_CLINIC_OR_DEPARTMENT_OTHER): Payer: Self-pay | Admitting: Physical Therapy
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
## 2022-11-28 ENCOUNTER — Telehealth: Payer: 59 | Admitting: Nurse Practitioner

## 2022-11-28 DIAGNOSIS — U071 COVID-19: Secondary | ICD-10-CM

## 2022-11-28 MED ORDER — NIRMATRELVIR/RITONAVIR (PAXLOVID)TABLET: 3.0000 | ORAL_TABLET | Freq: Two times a day (BID) | ORAL | 0 refills | Status: AC

## 2022-11-28 NOTE — Progress Notes (Signed)
Virtual Visit Consent   Jesse Hardin, you are scheduled for a virtual visit with a [facility] provider today. Just as with appointments in the office, your consent must be obtained to participate. Your consent will be active for this visit and any virtual visit you may have with one of our providers in the next 365 days. If you have a MyChart account, a copy of this consent can be sent to you electronically.  As this is a virtual visit, video technology does not allow for your provider to perform a traditional examination. This may limit your provider's ability to fully assess your condition. If your provider identifies any concerns that need to be evaluated in person or the need to arrange testing (such as labs, EKG, etc.), we will make arrangements to do so. Although advances in technology are sophisticated, we cannot ensure that it will always work on either your end or our end. If the connection with a video visit is poor, the visit may have to be switched to a telephone visit. With either a video or telephone visit, we are not always able to ensure that we have a secure connection.  By engaging in this virtual visit, you consent to the provision of healthcare and authorize for your insurance to be billed (if applicable) for the services provided during this visit. Depending on your insurance coverage, you may receive a charge related to this service.  I need to obtain your verbal consent now. Are you willing to proceed with your visit today? Jesse Hardin has provided verbal consent on 11/28/2022 for a virtual visit (video or telephone). Jesse Simas, FNP  Date: 11/28/2022 10:07 AM  Virtual Visit via Video Note   I, Jesse Hardin, connected with  Jesse Hardin  ([id number], 06/11/1960) on 11/28/22 at 10:15 AM EDT by a video-enabled telemedicine application and verified that I am speaking with the correct person using two identifiers.  Location: Patient: Virtual Visit Location  Patient: Home Provider: Virtual Visit Location Provider: Home Office   I discussed the limitations of evaluation and management by telemedicine and the availability of in person appointments. The patient expressed understanding and agreed to proceed.    History of Present Illness: Jesse Hardin is a 62 y.o. who identifies as a male who was assigned male at birth, and is being seen today after testing positive for COVID   He took a home COVID test this morning and was positive on two tests   Worsening symptoms onset last night  Sore throat, cough, sinus congestion No fever, body aches loss of energy   He has had COVID about 1.5 year ago  Did not have any anti-viral treatment at that time  He did develop a cough variant asthma after that infection and has been using Advair since that time and sees a pulmonologist    GFR from the past year was 75 Denies a history of kidney disease or injury    Problems:  Patient Active Problem List   Diagnosis Date Noted   Neck pain 10/04/2022   Decreased ROM of neck 10/04/2022   Radicular pain in left arm 10/04/2022   Upper back pain 10/04/2022   Elevated BP without diagnosis of hypertension 10/04/2022   Dacrocystitis, right 03/20/2022   Eczematous dermatitis of upper and lower eyelids of both eyes 03/20/2022   Cough variant asthma 12/08/2021   Hiatal hernia 09/23/2021   History of esophageal dilatation 09/23/2021   Tongue fissure 09/23/2021   Non-seasonal allergic rhinitis 06/03/2021  Asymptomatic varicose veins of both lower extremities 05/27/2021   Osteoporosis without current pathological fracture 01/04/2021   Screening for prostate cancer 11/24/2020   Radiculitis 11/24/2020   Screening for heart disease 11/24/2020   Attention and concentration deficit 07/08/2020   Hyperlipidemia 07/08/2020   Hemorrhoids 03/30/2020   Diverticulosis 03/30/2020   Polyp of colon 03/30/2020   Vaccine counseling 06/20/2017   Encounter for health  maintenance examination in adult 06/20/2017   Family history of heart disease 06/20/2017   Screen for colon cancer 06/20/2017   Generalized anxiety disorder 01/03/2016   Vitamin D deficiency 11/16/2015   Bipolar disorder with depression (HCC) 11/01/2015   Insomnia 11/01/2015   Low testosterone 11/01/2015    Allergies: No Known Allergies Medications:  Current Outpatient Medications:    b complex vitamins capsule, Take 1 capsule by mouth daily., Disp: 30 capsule, Rfl: 1   buPROPion (WELLBUTRIN XL) 300 MG 24 hr tablet, TAKE 1 TABLET BY MOUTH EVERY DAY, Disp: 90 tablet, Rfl: 1   Cetirizine HCl (ZYRTEC PO), Take by mouth. Alternating every other day with allegra, Disp: , Rfl:    chlorhexidine (HIBICLENS) 4 % external liquid, Apply topically daily as needed., Disp: 946 mL, Rfl: 1   FEXOFENADINE HCL PO, Take by mouth. Every other day, Disp: , Rfl:    fluticasone-salmeterol (ADVAIR) 500-50 MCG/ACT AEPB, Inhale 1 puff into the lungs in the morning and at bedtime., Disp: 60 each, Rfl: 5   methylphenidate (RITALIN LA) 30 MG 24 hr capsule, TAKE ONE CAPSULE BY MOUTH ONCE A DAY, Disp: 30 capsule, Rfl: 0   naproxen sodium (ALEVE) 220 MG tablet, Take 220 mg by mouth as needed. (Patient not taking: Reported on 11/17/2022), Disp: , Rfl:    pantoprazole (PROTONIX) 40 MG tablet, Take 40 mg by mouth daily., Disp: , Rfl:    rosuvastatin (CRESTOR) 10 MG tablet, TAKE 1 TABLET BY MOUTH EVERY DAY, Disp: 90 tablet, Rfl: 0   Testosterone 20.25 MG/ACT (1.62%) GEL, APPLY 3 SQUIRTS ONTO THE SKIN DAILY, Disp: 75 g, Rfl: 2   VITAMIN D3 50 MCG (2000 UT) capsule, TAKE 1 CAPSULE BY MOUTH EVERY DAY, Disp: 90 capsule, Rfl: 1  Observations/Objective: Patient is well-developed, well-nourished in no acute distress.  Resting comfortably  at home.  Head is normocephalic, atraumatic.  No labored breathing.  Speech is clear and coherent with logical content.  Patient is alert and oriented at baseline.    Assessment and  Plan:  1. COVID-19  *take anti-viral with food  Immune support as discussed Continue management of symptoms with OTC medications as directed Discussed CDC guidelines for isolation     Meds ordered this encounter  Medications   nirmatrelvir/ritonavir (PAXLOVID) 20 x 150 MG & 10 x 100MG  TABS    Sig: Take 3 tablets by mouth 2 (two) times daily for 5 days. (Take nirmatrelvir 150 mg two tablets twice daily for 5 days and ritonavir 100 mg one tablet twice daily for 5 days) Patient GFR is 75    Dispense:  30 tablet    Refill:  0         Follow Up Instructions: I discussed the assessment and treatment plan with the patient. The patient was provided an opportunity to ask questions and all were answered. The patient agreed with the plan and demonstrated an understanding of the instructions.  A copy of instructions were sent to the patient via MyChart unless otherwise noted below.    The patient was advised to call back or seek  an in-person evaluation if the symptoms worsen or if the condition fails to improve as anticipated.  Time:  I spent 14 minutes with the patient via telehealth technology discussing the above problems/concerns.    Jesse Simas, FNP

## 2022-12-04 ENCOUNTER — Other Ambulatory Visit: Payer: Self-pay | Admitting: Medical

## 2022-12-06 ENCOUNTER — Other Ambulatory Visit: Payer: Self-pay | Admitting: Medical

## 2022-12-06 MED ORDER — METHYLPHENIDATE HCL ER (LA) 30 MG PO CP24: 30.0000 mg | ORAL_CAPSULE | ORAL | 0 refills | Status: DC

## 2022-12-07 ENCOUNTER — Encounter (HOSPITAL_BASED_OUTPATIENT_CLINIC_OR_DEPARTMENT_OTHER): Payer: Self-pay | Admitting: Physical Therapy

## 2022-12-07 ENCOUNTER — Ambulatory Visit (HOSPITAL_BASED_OUTPATIENT_CLINIC_OR_DEPARTMENT_OTHER): Payer: 59 | Attending: Medical | Admitting: Physical Therapy

## 2022-12-07 DIAGNOSIS — M542 Cervicalgia: Secondary | ICD-10-CM | POA: Diagnosis present

## 2022-12-07 DIAGNOSIS — M5412 Radiculopathy, cervical region: Secondary | ICD-10-CM | POA: Diagnosis present
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
## 2022-12-11 ENCOUNTER — Encounter (HOSPITAL_BASED_OUTPATIENT_CLINIC_OR_DEPARTMENT_OTHER): Payer: Self-pay | Admitting: Physical Therapy

## 2022-12-11 ENCOUNTER — Ambulatory Visit (HOSPITAL_BASED_OUTPATIENT_CLINIC_OR_DEPARTMENT_OTHER): Payer: 59 | Admitting: Physical Therapy

## 2022-12-11 DIAGNOSIS — M5412 Radiculopathy, cervical region: Secondary | ICD-10-CM

## 2022-12-11 DIAGNOSIS — M542 Cervicalgia: Secondary | ICD-10-CM
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
## 2022-12-13 ENCOUNTER — Encounter: Payer: BC Managed Care – PPO | Admitting: Medical

## 2022-12-18 ENCOUNTER — Encounter (HOSPITAL_BASED_OUTPATIENT_CLINIC_OR_DEPARTMENT_OTHER): Payer: Self-pay | Admitting: Physical Therapy

## 2022-12-18 ENCOUNTER — Ambulatory Visit (HOSPITAL_BASED_OUTPATIENT_CLINIC_OR_DEPARTMENT_OTHER): Payer: 59 | Admitting: Physical Therapy

## 2022-12-18 DIAGNOSIS — M542 Cervicalgia: Secondary | ICD-10-CM | POA: Diagnosis not present

## 2022-12-18 DIAGNOSIS — M5412 Radiculopathy, cervical region: Secondary | ICD-10-CM
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
## 2022-12-19 ENCOUNTER — Encounter: Payer: Self-pay | Admitting: Medical

## 2022-12-19 ENCOUNTER — Ambulatory Visit (INDEPENDENT_AMBULATORY_CARE_PROVIDER_SITE_OTHER): Payer: 59 | Admitting: Medical

## 2022-12-19 VITALS — BP 150/90 | HR 82 | Temp 97.1°F | Wt 181.2 lb

## 2022-12-19 DIAGNOSIS — M542 Cervicalgia: Secondary | ICD-10-CM

## 2022-12-19 DIAGNOSIS — M549 Dorsalgia, unspecified: Secondary | ICD-10-CM | POA: Diagnosis not present

## 2022-12-19 DIAGNOSIS — M792 Neuralgia and neuritis, unspecified: Secondary | ICD-10-CM

## 2022-12-19 MED ORDER — OXYCODONE-ACETAMINOPHEN 7.5-325 MG PO TABS: 1.0000 | ORAL_TABLET | Freq: Two times a day (BID) | ORAL | 0 refills | Status: DC | PRN

## 2022-12-19 MED ORDER — METHYLPREDNISOLONE ACETATE 80 MG/ML IJ SUSP
80.0000 mg | Freq: Once | INTRAMUSCULAR | Status: AC
  Administered 2022-12-19: 80 mg via INTRAMUSCULAR

## 2022-12-19 MED ORDER — DIAZEPAM 5 MG PO TABS: 5.0000 mg | ORAL_TABLET | Freq: Two times a day (BID) | ORAL | 0 refills | Status: DC | PRN

## 2022-12-19 NOTE — Addendum Note (Signed)
Addended by: Herminio Commons A on: 12/19/2022 01:30 PM   Modules accepted: Orders
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
## 2022-12-29 ENCOUNTER — Other Ambulatory Visit: Payer: Self-pay | Admitting: Medical

## 2022-12-29 ENCOUNTER — Encounter (HOSPITAL_BASED_OUTPATIENT_CLINIC_OR_DEPARTMENT_OTHER): Payer: Self-pay | Admitting: Physical Therapy

## 2022-12-29 ENCOUNTER — Ambulatory Visit (HOSPITAL_BASED_OUTPATIENT_CLINIC_OR_DEPARTMENT_OTHER): Payer: 59 | Admitting: Physical Therapy

## 2022-12-29 DIAGNOSIS — M542 Cervicalgia: Secondary | ICD-10-CM | POA: Diagnosis not present

## 2022-12-29 DIAGNOSIS — M5412 Radiculopathy, cervical region: Secondary | ICD-10-CM
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
## 2023-01-04 ENCOUNTER — Ambulatory Visit: Payer: 59 | Admitting: Medical

## 2023-01-04 ENCOUNTER — Encounter: Payer: Self-pay | Admitting: Medical

## 2023-01-04 VITALS — BP 120/78 | HR 83 | Ht 68.0 in | Wt 177.6 lb

## 2023-01-04 DIAGNOSIS — Z131 Encounter for screening for diabetes mellitus: Secondary | ICD-10-CM

## 2023-01-04 DIAGNOSIS — E559 Vitamin D deficiency, unspecified: Secondary | ICD-10-CM | POA: Diagnosis not present

## 2023-01-04 DIAGNOSIS — Z23 Encounter for immunization: Secondary | ICD-10-CM

## 2023-01-04 DIAGNOSIS — Z7185 Encounter for immunization safety counseling: Secondary | ICD-10-CM

## 2023-01-04 DIAGNOSIS — Z0000 Encounter for general adult medical examination without abnormal findings: Secondary | ICD-10-CM

## 2023-01-04 DIAGNOSIS — R3129 Other microscopic hematuria: Secondary | ICD-10-CM

## 2023-01-04 DIAGNOSIS — E785 Hyperlipidemia, unspecified: Secondary | ICD-10-CM

## 2023-01-04 DIAGNOSIS — F411 Generalized anxiety disorder: Secondary | ICD-10-CM

## 2023-01-04 DIAGNOSIS — Z125 Encounter for screening for malignant neoplasm of prostate: Secondary | ICD-10-CM

## 2023-01-04 DIAGNOSIS — M810 Age-related osteoporosis without current pathological fracture: Secondary | ICD-10-CM

## 2023-01-04 DIAGNOSIS — R7989 Other specified abnormal findings of blood chemistry: Secondary | ICD-10-CM

## 2023-01-04 LAB — POCT URINALYSIS DIP (PROADVANTAGE DEVICE)
Bilirubin, UA: NEGATIVE
Glucose, UA: NEGATIVE mg/dL
Ketones, POC UA: NEGATIVE mg/dL
Leukocytes, UA: NEGATIVE
Nitrite, UA: NEGATIVE
Protein Ur, POC: NEGATIVE mg/dL
Specific Gravity, Urine: 1.02
Urobilinogen, Ur: 0.2
pH, UA: 6 (ref 5.0–8.0)

## 2023-01-04 NOTE — Patient Instructions (Addendum)
This visit was a preventative care visit, also known as wellness visit or routine physical.   Topics typically include healthy lifestyle, diet, exercise, preventative care, vaccinations, sick and well care, proper use of emergency dept and after hours care, as well as other concerns.     Recommendations: Continue to return yearly for your annual wellness and preventative care visits.  This gives [place] a chance to discuss healthy lifestyle, exercise, vaccinations, review your chart record, and perform screenings where appropriate.  I recommend you see your eye doctor yearly for routine vision care.  I recommend you see your dentist yearly for routine dental care including hygiene visits twice yearly.   Vaccination recommendations were reviewed Immunization History  Administered Date(s) Administered   Influenza Inj Mdck Quad Pf 01/21/2017   Influenza Split 02/15/2009   Influenza, Seasonal, Injecte, Preservative Fre 12/01/2013, 02/16/2015   Influenza,inj,Quad PF,6+ Mos 02/19/2018, 01/04/2021, 12/08/2021   Influenza-Unspecified 12/08/2017, 01/08/2019, 02/07/2020   Janssen (J&J) SARS-COV-2 Vaccination 06/13/2019   PFIZER(Purple Top)SARS-COV-2 Vaccination 01/26/2020   Pfizer Covid-19 Vaccine Bivalent Booster 59yrs & up 01/04/2021   Pneumococcal Conjugate-13 12/08/2021   Tdap 04/07/2013   Unspecified SARS-COV-2 Vaccination 01/07/2022   Counseled on the influenza virus vaccine.  Vaccine information sheet given.  Influenza vaccine given after consent obtained.  Shingles vaccine:  I recommend you have a shingles vaccine to help prevent shingles or herpes zoster outbreak.   Please call your insurer to inquire about coverage for the Shingrix vaccine given in 2 doses.   Some insurers cover this vaccine after age 6, some cover this after age 14.  If your insurer covers this, then call to schedule appointment to have this vaccine here.   Screening for cancer: Colon cancer screening: I reviewed your  colonoscopy on file that is up to date from 2022  We discussed PSA, prostate exam, and prostate cancer screening risks/benefits.     Skin cancer screening: Check your skin regularly for new changes, growing lesions, or other lesions of concern Come in for evaluation if you have skin lesions of concern.  Lung cancer screening: If you have a greater than 20 pack year history of tobacco use, then you may qualify for lung cancer screening with a chest CT scan.   Please call your insurance company to inquire about coverage for this test.  We currently don't have screenings for other cancers besides breast, cervical, colon, and lung cancers.  If you have a strong family history of cancer or have other cancer screening concerns, please let me know.    Bone health: Get at least 150 minutes of aerobic exercise weekly Get weight bearing exercise at least once weekly Bone density test:  A bone density test is an imaging test that uses a type of X-ray to measure the amount of calcium and other minerals in your bones. The test may be used to diagnose or screen you for a condition that causes weak or thin bones (osteoporosis), predict your risk for a broken bone (fracture), or determine how well your osteoporosis treatment is working. The bone density test is recommended for females 65 and older, or females or males <65 if certain risk factors such as thyroid disease, long term use of steroids such as for asthma or rheumatological issues, vitamin D deficiency, estrogen deficiency, family history of osteoporosis, self or family history of fragility fracture in first degree relative.  Due for repeat bone density 2024.  Continue Prolia injection begun 2022  Please call to schedule your bone density.  [facility]  [phone] [address] [place], [place] [address]    Heart health: Get at least 150 minutes of aerobic exercise weekly Limit alcohol It is  important to maintain a healthy blood pressure and healthy cholesterol numbers  Heart disease screening: Screening for heart disease includes screening for blood pressure, fasting lipids, glucose/diabetes screening, BMI height to weight ratio, reviewed of smoking status, physical activity, and diet.    Goals include blood pressure 120/80 or less, maintaining a healthy lipid/cholesterol profile, preventing diabetes or keeping diabetes numbers under good control, not smoking or using tobacco products, exercising most days per week or at least 150 minutes per week of exercise, and eating healthy variety of fruits and vegetables, healthy oils, and avoiding unhealthy food choices like fried food, fast food, high sugar and high cholesterol foods.    Consider CT coronary heart disease screen, $95 cash pay.   If agreeable, let me know and I'll schedule.     Medical care options: I recommend you continue to seek care here first for routine care.  We try really hard to have available appointments Monday through Friday daytime hours for sick visits, acute visits, and physicals.  Urgent care should be used for after hours and weekends for significant issues that cannot wait till the next day.  The emergency department should be used for significant potentially life-threatening emergencies.  The emergency department is expensive, can often have long wait times for less significant concerns, so try to utilize primary care, urgent care, or telemedicine when possible to avoid unnecessary trips to the emergency department.  Virtual visits and telemedicine have been introduced since the pandemic started in 2020, and can be convenient ways to receive medical care.  We offer virtual appointments as well to assist you in a variety of options to seek medical care.   Advanced Directives: I recommend you consider completing a Health Care Power of Attorney and Living Will.   These documents respect your wishes and help  alleviate burdens on your loved ones if you were to become terminally ill or be in a position to need those documents enforced.    You can complete Advanced Directives yourself, have them notarized, then have copies made for our office, for you and for anybody you feel should have them in safe keeping.  Or, you can have an attorney prepare these documents.   If you haven't updated your Last Will and Testament in a while, it may be worthwhile having an attorney prepare these documents together and save on some costs.       Separate significant issues discussed: Hypogonadism - on TST therapy daily, 3 pumps daily.   Labs today  ADHD - doing ok on current therapy  Bipolar depression - continue current medication Wellbutrin and Ritalin  Chronic cough, asthma diagnosis per pulmonology.  Continue albuterol as needed.  Hyperlipidemia - continue statin, labs today  Vit d deficiency - continue supplement  Seeing PT currently for back pain, neck pain

## 2023-01-04 NOTE — Addendum Note (Signed)
Addended by: Debbrah Alar F on: 01/04/2023 11:37 AM   Modules accepted: Orders
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
## 2023-01-05 ENCOUNTER — Other Ambulatory Visit: Payer: Self-pay | Admitting: Medical

## 2023-01-05 ENCOUNTER — Encounter (HOSPITAL_BASED_OUTPATIENT_CLINIC_OR_DEPARTMENT_OTHER): Payer: Self-pay | Admitting: Physical Therapy

## 2023-01-05 ENCOUNTER — Ambulatory Visit (HOSPITAL_BASED_OUTPATIENT_CLINIC_OR_DEPARTMENT_OTHER): Payer: 59 | Attending: Medical | Admitting: Physical Therapy

## 2023-01-05 DIAGNOSIS — M542 Cervicalgia: Secondary | ICD-10-CM | POA: Insufficient documentation

## 2023-01-05 DIAGNOSIS — M5412 Radiculopathy, cervical region: Secondary | ICD-10-CM | POA: Insufficient documentation

## 2023-01-05 LAB — PSA: Prostate Specific Ag, Serum: 0.6 ng/mL (ref 0.0–4.0)

## 2023-01-05 LAB — LIPID PANEL
Chol/HDL Ratio: 2.6 {ratio} (ref 0.0–5.0)
Cholesterol, Total: 175 mg/dL (ref 100–199)
HDL: 67 mg/dL (ref >39)
LDL Chol Calc (NIH): 93 mg/dL (ref 0–99)
Triglycerides: 79 mg/dL (ref 0–149)
VLDL Cholesterol Cal: 15 mg/dL (ref 5–40)

## 2023-01-05 LAB — HEMOGLOBIN A1C
Est. average glucose Bld gHb Est-mCnc: 120 mg/dL
Hgb A1c MFr Bld: 5.8 % — ABNORMAL HIGH (ref 4.8–5.6)

## 2023-01-05 LAB — URINALYSIS, MICROSCOPIC ONLY
Bacteria, UA: NONE SEEN
Casts: NONE SEEN /[LPF]
Epithelial Cells (non renal): NONE SEEN /[HPF] (ref 0–10)
RBC, Urine: NONE SEEN /[HPF] (ref 0–2)
WBC, UA: NONE SEEN /[HPF] (ref 0–5)

## 2023-01-05 LAB — TESTOSTERONE: Testosterone: 452 ng/dL (ref 264–916)

## 2023-01-05 MED ORDER — ROSUVASTATIN CALCIUM 10 MG PO TABS: 10.0000 mg | ORAL_TABLET | Freq: Every day | ORAL | 0 refills | Status: DC

## 2023-01-05 MED ORDER — BUPROPION HCL ER (XL) 300 MG PO TB24: 300.0000 mg | ORAL_TABLET | Freq: Every day | ORAL | 1 refills | Status: DC

## 2023-01-05 MED ORDER — METHYLPHENIDATE HCL ER (LA) 30 MG PO CP24: 30.0000 mg | ORAL_CAPSULE | ORAL | 0 refills | Status: DC
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
## 2023-01-05 NOTE — Progress Notes (Signed)
Results sent through MyChart

## 2023-01-12 ENCOUNTER — Encounter (HOSPITAL_BASED_OUTPATIENT_CLINIC_OR_DEPARTMENT_OTHER): Payer: 59

## 2023-01-19 ENCOUNTER — Encounter (HOSPITAL_BASED_OUTPATIENT_CLINIC_OR_DEPARTMENT_OTHER): Payer: 59 | Admitting: Physical Therapy

## 2023-01-26 ENCOUNTER — Encounter (HOSPITAL_BASED_OUTPATIENT_CLINIC_OR_DEPARTMENT_OTHER): Payer: 59 | Admitting: Physical Therapy

## 2023-01-30 ENCOUNTER — Ambulatory Visit: Payer: 59 | Admitting: Medical

## 2023-01-30 VITALS — BP 110/72 | HR 62 | Wt 180.6 lb

## 2023-01-30 DIAGNOSIS — H938X2 Other specified disorders of left ear: Secondary | ICD-10-CM | POA: Diagnosis not present

## 2023-01-30 DIAGNOSIS — H9192 Unspecified hearing loss, left ear: Secondary | ICD-10-CM | POA: Diagnosis not present

## 2023-01-30 NOTE — Progress Notes (Signed)
Subjective:  Jesse Hardin is a 62 y.o. male who presents for Chief Complaint  Patient presents with   Consult    Had fluid drainage, had hearing test last week and was going to be fit for hearing aid but couldn't due to fluid in ear. Somewhat hurts and irritated      Here for ear issue.  He was seen by audiology last week.  Hearing aids.  They could not fit him because there was fluid behind the eardrums.  They advise he see PCP to help clear this up.  He did take some Sudafed a couple days of the weekend.  He was have a little bit of congestion but that has improved.  In general he takes allergy pill every day for chronic allergy problems.  He alternates Allegra and Claritin daily  No fever, no cough, no sore throat, no phlegm or colored mucus  No other aggravating or relieving factors.    No other c/o.  The following portions of the patient's history were reviewed and updated as appropriate: allergies, current medications, past family history, past medical history, past social history, past surgical history and problem list.  ROS Otherwise as in subjective above  Objective: BP 110/72   Pulse 62   Wt 180 lb 9.6 oz (81.9 kg)   BMI 27.46 kg/m   General appearance: alert, no distress, well developed, well nourished HEENT: normocephalic, sclerae anicteric, conjunctiva pink and moist, right TM normal, left TM flat with slightly reduced light reflex but no air-fluid levels, no erythema    Assessment: Encounter Diagnoses  Name Primary?   Ear pressure, left Yes   Decreased hearing of left ear      Plan: Advised Sudafed twice a day for the next 3 to 7 days, increase water intake.  He can continue his normal allergy pill daily.  Recheck in 7 to 10 days for brief ear check  Nandan "TJ" was seen today for consult.  Diagnoses and all orders for this visit:  Ear pressure, left  Decreased hearing of left ear    Follow up: 7- 10 day ear check

## 2023-02-02 ENCOUNTER — Encounter (HOSPITAL_BASED_OUTPATIENT_CLINIC_OR_DEPARTMENT_OTHER): Payer: 59 | Admitting: Physical Therapy

## 2023-02-02 ENCOUNTER — Other Ambulatory Visit: Payer: Self-pay | Admitting: Internal Medicine

## 2023-02-02 DIAGNOSIS — M810 Age-related osteoporosis without current pathological fracture: Secondary | ICD-10-CM

## 2023-02-02 MED ORDER — DENOSUMAB 60 MG/ML ~~LOC~~ SOSY
60.0000 mg | PREFILLED_SYRINGE | Freq: Once | SUBCUTANEOUS | Status: AC
Start: 2023-03-24 — End: 2023-03-26
  Administered 2023-03-26: 60 mg via SUBCUTANEOUS

## 2023-02-06 ENCOUNTER — Ambulatory Visit (INDEPENDENT_AMBULATORY_CARE_PROVIDER_SITE_OTHER): Payer: 59 | Admitting: Medical

## 2023-02-06 DIAGNOSIS — H938X9 Other specified disorders of ear, unspecified ear: Secondary | ICD-10-CM

## 2023-02-06 NOTE — Progress Notes (Signed)
Subjective:  Jesse Hardin is a 62 y.o. male who presents for Chief Complaint  Patient presents with   check ear    Check ears. Needs refill on ritalie     Here for recheck on ears.  I saw him 01/30/2023 on request of audiology this to fluid in the ears that need to be resolved before fitting for hearing aids.  Last visit we recommended Sudafed short-term which she has completed.  He feels absolutely fine today.  No other aggravating or relieving factors.    No other c/o.  The following portions of the patient's history were reviewed and updated as appropriate: allergies, current medications, past family history, past medical history, past social history, past surgical history and problem list.  ROS Otherwise as in subjective above    Objective: There were no vitals taken for this visit.  TMs normal appearing   Assessment: Encounter Diagnosis  Name Primary?   Pressure sensation in ear, unspecified laterality Yes      Plan: Ears look fine, no current symptoms, follow-up audiology

## 2023-03-06 ENCOUNTER — Telehealth: Payer: Self-pay

## 2023-03-06 NOTE — Telephone Encounter (Signed)
Prior authorization on file for Prolia for 2 units  from 04/03/22 to 03/07/23  Approval # [id number]  Per [employer] portal - No prior authorization required   Reference# [id number]

## 2023-03-08 ENCOUNTER — Other Ambulatory Visit: Payer: Self-pay | Admitting: Medical

## 2023-03-08 MED ORDER — METHYLPHENIDATE HCL ER (LA) 30 MG PO CP24: 30.0000 mg | ORAL_CAPSULE | ORAL | 0 refills | Status: DC

## 2023-03-21 ENCOUNTER — Encounter: Payer: Self-pay | Admitting: Internal Medicine

## 2023-03-21 NOTE — Telephone Encounter (Signed)
Pt ready for scheduling for Prolia on or after : 03/24/23  Out-of-pocket cost due at time of visit: $~150  Primary: [employer] - Commercial Prolia co-insurance: 10% Admin fee co-insurance: 10%  Secondary: N/A Prolia co-insurance:  Admin fee co-insurance:   Medical Benefit Details: Date Benefits were checked: 02/07/23 Deductible: $1250 (met)/ Coinsurance: 10%/ Admin Fee: 10%  Prior Auth: Not required PA# Expiration Date:   # of doses approved:  Pharmacy benefit: Copay $-- If patient wants fill through the pharmacy benefit please send prescription to:  -- , and include estimated need by date in rx notes. Pharmacy will ship medication directly to the office.  Patient is eligible for Prolia Copay Card. Copay Card can make patient's cost as little as $25. Link to apply: [email or web address]://[email or web address]  ** This summary of benefits is an estimation of the patient's out-of-pocket cost. Exact cost may very based on individual plan coverage.

## 2023-03-26 ENCOUNTER — Other Ambulatory Visit (INDEPENDENT_AMBULATORY_CARE_PROVIDER_SITE_OTHER): Payer: 59

## 2023-03-26 DIAGNOSIS — M810 Age-related osteoporosis without current pathological fracture: Secondary | ICD-10-CM

## 2023-04-02 ENCOUNTER — Other Ambulatory Visit: Payer: Self-pay | Admitting: Medical

## 2023-04-11 ENCOUNTER — Ambulatory Visit (INDEPENDENT_AMBULATORY_CARE_PROVIDER_SITE_OTHER): Payer: 59 | Admitting: Medical

## 2023-04-11 VITALS — Temp 97.7°F | Wt 178.8 lb

## 2023-04-11 DIAGNOSIS — S91209A Unspecified open wound of unspecified toe(s) with damage to nail, initial encounter: Secondary | ICD-10-CM | POA: Diagnosis not present

## 2023-04-11 DIAGNOSIS — G8929 Other chronic pain: Secondary | ICD-10-CM

## 2023-04-11 DIAGNOSIS — S99921A Unspecified injury of right foot, initial encounter: Secondary | ICD-10-CM

## 2023-04-11 DIAGNOSIS — M25511 Pain in right shoulder: Secondary | ICD-10-CM | POA: Diagnosis not present

## 2023-04-11 DIAGNOSIS — R41840 Attention and concentration deficit: Secondary | ICD-10-CM | POA: Diagnosis not present

## 2023-04-11 DIAGNOSIS — Z9889 Other specified postprocedural states: Secondary | ICD-10-CM

## 2023-04-11 MED ORDER — METHYLPHENIDATE HCL ER (LA) 30 MG PO CP24: 30.0000 mg | ORAL_CAPSULE | ORAL | 0 refills | Status: DC
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
## 2023-04-13 ENCOUNTER — Ambulatory Visit: Payer: 59 | Admitting: Podiatry

## 2023-04-13 ENCOUNTER — Encounter: Payer: Self-pay | Admitting: Podiatry

## 2023-04-13 ENCOUNTER — Telehealth: Payer: Self-pay | Admitting: Medical

## 2023-04-13 DIAGNOSIS — L603 Nail dystrophy: Secondary | ICD-10-CM

## 2023-04-13 DIAGNOSIS — R41840 Attention and concentration deficit: Secondary | ICD-10-CM

## 2023-04-13 MED ORDER — METHYLPHENIDATE HCL ER (LA) 30 MG PO CP24: 30.0000 mg | ORAL_CAPSULE | ORAL | 0 refills | Status: DC

## 2023-04-13 NOTE — Telephone Encounter (Signed)
 01/08 script for ritalin sent to [facility] due to unavailability at [facility]. Future scripts remain unchanged.
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
## 2023-04-13 NOTE — Telephone Encounter (Signed)
 [facility]  Pt is out, can you send 04/11/23 rx only to [facility]

## 2023-04-16 ENCOUNTER — Other Ambulatory Visit: Payer: Self-pay | Admitting: Medical

## 2023-04-16 DIAGNOSIS — R41840 Attention and concentration deficit: Secondary | ICD-10-CM

## 2023-04-20 ENCOUNTER — Ambulatory Visit: Payer: 59 | Admitting: Family Medicine

## 2023-04-26 ENCOUNTER — Other Ambulatory Visit: Payer: Self-pay

## 2023-04-26 ENCOUNTER — Encounter: Payer: Self-pay | Admitting: Family Medicine

## 2023-04-26 ENCOUNTER — Ambulatory Visit: Payer: 59 | Admitting: Family Medicine

## 2023-04-26 VITALS — BP 136/83 | Ht 68.0 in | Wt 177.0 lb

## 2023-04-26 DIAGNOSIS — M12811 Other specific arthropathies, not elsewhere classified, right shoulder: Secondary | ICD-10-CM

## 2023-04-26 DIAGNOSIS — G8929 Other chronic pain: Secondary | ICD-10-CM
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
## 2023-04-26 NOTE — Patient Instructions (Signed)
You have a chronic rotator cuff tear in the right shoulder. Try to avoid painful activities (overhead activities, lifting with extended arm) as much as possible. Can take ibuprofen with food for pain and inflammation as needed. Can take tylenol in addition to this. If pain is bad enough we can consider cortisone injections in the future. We have referred you to physical therapy and provided some shoulder home exercises to work on over the next few weeks and between PT appointments. Try to do these 3 sets of 10 daily. Follow up with me in 6-8 weeks.

## 2023-05-04 ENCOUNTER — Other Ambulatory Visit: Payer: Self-pay | Admitting: Medical

## 2023-05-10 ENCOUNTER — Encounter: Payer: Self-pay | Admitting: Family Medicine

## 2023-05-10 ENCOUNTER — Ambulatory Visit (INDEPENDENT_AMBULATORY_CARE_PROVIDER_SITE_OTHER): Payer: 59 | Admitting: Family Medicine

## 2023-05-10 VITALS — BP 126/86 | HR 76 | Temp 98.3°F | Ht 68.0 in | Wt 181.0 lb

## 2023-05-10 DIAGNOSIS — H60502 Unspecified acute noninfective otitis externa, left ear: Secondary | ICD-10-CM

## 2023-05-10 MED ORDER — NEOMYCIN-POLYMYXIN-HC 3.5-10000-1 OT SOLN: 4.0000 [drp] | Freq: Four times a day (QID) | OTIC | 0 refills | Status: DC

## 2023-05-10 NOTE — Progress Notes (Signed)
 Chief Complaint  Patient presents with   Ear Pain    Left ear pain that started last week. Drainage started last week, he started using Debrox. Two days ago started hurting and has worsened today.    Last week he woke up and felt some drainage from the left ear. He noticed this for a few days.   Friday 1/31 he noticed L ear felt plugged. He started using ear wax drops. He used it for a couple of days, and cleared up some. Pain started in the L ear 4 nights ago.  It was worse yesterday, and even worse today.  He can hear bubbles in the left ear when he blows his nose. Denies decreased hearing. Pain is fairly constant today.  No fever. No sinus pain, runny nose, sore throat, cough. He takes antihistamines daily.    PMH, PSH, SH reviewed  Outpatient Encounter Medications as of 05/10/2023  Medication Sig Note   b complex vitamins capsule Take 1 capsule by mouth daily.    buPROPion  (WELLBUTRIN  XL) 300 MG 24 hr tablet Take 1 tablet (300 mg total) by mouth daily.    Cetirizine HCl (ZYRTEC PO) Take by mouth. Alternating every other day with allegra 05/10/2023: Alternates one day zyrtec, the next generic allegra     Cholecalciferol (VITAMIN D3) 50 MCG (2000 UT) capsule TAKE 1 CAPSULE BY MOUTH EVERY DAY    FEXOFENADINE HCL PO Take by mouth. Every other day 05/10/2023: Alternates with zyrtec   [START ON 05/12/2023] methylphenidate  (RITALIN  LA) 30 MG 24 hr capsule Take 1 capsule (30 mg total) by mouth every morning.    pantoprazole  (PROTONIX ) 40 MG tablet Take 40 mg by mouth daily.    rosuvastatin  (CRESTOR ) 10 MG tablet TAKE 1 TABLET BY MOUTH EVERY DAY    Testosterone  20.25 MG/ACT (1.62%) GEL APPLY 3 SQUIRTS ONTO THE SKIN DAILY    fluticasone -salmeterol (ADVAIR) 500-50 MCG/ACT AEPB Inhale 1 puff into the lungs in the morning and at bedtime. (Patient not taking: Reported on 05/10/2023) 05/10/2023: As needed   [START ON 06/09/2023] methylphenidate  (RITALIN  LA) 30 MG 24 hr capsule Take 1 capsule (30 mg total) by  mouth every morning. (Patient not taking: Reported on 05/10/2023)    methylphenidate  (RITALIN  LA) 30 MG 24 hr capsule Take 1 capsule (30 mg total) by mouth every morning. (Patient not taking: Reported on 05/10/2023)    No facility-administered encounter medications on file as of 05/10/2023.   No Known Allergies   ROS: No fever, chills URI symptoms.  L ear pain per HPI. No n/v/d, rash. No dizziness. Slight headaches. Wears hearing aids.   PHYSICAL EXAM:  BP 126/86   Pulse 76   Temp 98.3 F (36.8 C) (Tympanic)   Ht 5' 8 (1.727 m)   Wt 181 lb (82.1 kg)   BMI 27.52 kg/m   Well-appearing, pleasant male, with occasional throat-clearing. HEENT: PERRL, EOMI, conjunctiva and sclera are clear. R TM and EAC normal. L EAC is swollen, some white exudate noted, no cerumen. Pain with movement of L external ear. TM is not bulging, but noted to have some yellow fluid posterior, and a slightly broad light reflex. Nontender at [employer]. OP is clear Sinuses are nontender Neck: no lymphadenopathy or mass Heart: regular rate and rhythm Lungs: clear bilaterally Neuro: alert and oriented, normal gait.  Some asymmetry of mouth with speaking noted Psych: normal mood, affect, hygiene and grooming   ASSESSMENT/PLAN:  Acute otitis externa of left ear, unspecified type - Plan: neomycin -polymyxin-hydrocortisone (CORTISPORIN)  OTIC solution  Unsure if related to recent drops, new hearing aids, or other contributing factors. Some effusion, but doesn't appear infected. Start with drops for OE; contact us  for oral antibiotic if ear pain not resolving.   Use tylenol  as needed for pain. Use the ear drops three to four times/day (4 drops to the left ear). Use it for up to 10 days. If you have persistent or worsening pain despite using these drops, contact us  for an oral antibiotic.

## 2023-05-10 NOTE — Patient Instructions (Signed)
 Use tylenol  as needed for pain. Use the ear drops three to four times/day (4 drops to the left ear). Use it for up to 10 days. If you have persistent or worsening pain despite using these drops, contact us  for an oral antibiotic.

## 2023-05-11 ENCOUNTER — Ambulatory Visit: Payer: 59 | Admitting: Medical

## 2023-05-14 ENCOUNTER — Telehealth: Payer: Self-pay | Admitting: Medical

## 2023-05-14 ENCOUNTER — Other Ambulatory Visit: Payer: Self-pay | Admitting: Medical

## 2023-05-14 DIAGNOSIS — R41840 Attention and concentration deficit: Secondary | ICD-10-CM

## 2023-05-14 MED ORDER — METHYLPHENIDATE HCL ER (LA) 30 MG PO CP24: 30.0000 mg | ORAL_CAPSULE | ORAL | 0 refills | Status: DC

## 2023-05-14 NOTE — Telephone Encounter (Signed)
 Pt needs his ritalin  sent to [id number] - [place], [place] - [address] Current pharmacy is out.

## 2023-05-23 ENCOUNTER — Ambulatory Visit: Payer: 59 | Admitting: Physical Therapy

## 2023-06-04 ENCOUNTER — Ambulatory Visit: Payer: 59 | Admitting: Physical Therapy

## 2023-06-04 DIAGNOSIS — M25511 Pain in right shoulder: Secondary | ICD-10-CM | POA: Diagnosis not present
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
## 2023-06-08 ENCOUNTER — Encounter: Payer: Self-pay | Admitting: Physical Therapy

## 2023-06-13 ENCOUNTER — Other Ambulatory Visit: Payer: Self-pay | Admitting: Medical

## 2023-06-13 ENCOUNTER — Ambulatory Visit (HOSPITAL_BASED_OUTPATIENT_CLINIC_OR_DEPARTMENT_OTHER): Payer: 59 | Admitting: Pulmonary Disease

## 2023-06-13 ENCOUNTER — Ambulatory Visit: Payer: Self-pay | Admitting: Medical

## 2023-06-13 ENCOUNTER — Encounter (HOSPITAL_BASED_OUTPATIENT_CLINIC_OR_DEPARTMENT_OTHER): Payer: Self-pay | Admitting: Pulmonary Disease

## 2023-06-13 DIAGNOSIS — K219 Gastro-esophageal reflux disease without esophagitis: Secondary | ICD-10-CM

## 2023-06-13 DIAGNOSIS — J4550 Severe persistent asthma, uncomplicated: Secondary | ICD-10-CM

## 2023-06-13 MED ORDER — IPRATROPIUM BROMIDE 0.06 % NA SOLN: 2.0000 | Freq: Four times a day (QID) | NASAL | 2 refills | Status: DC

## 2023-06-13 MED ORDER — METHYLPHENIDATE HCL ER (LA) 30 MG PO CP24: 30.0000 mg | ORAL_CAPSULE | ORAL | 0 refills | Status: DC

## 2023-06-13 MED ORDER — FLUTICASONE-SALMETEROL 500-50 MCG/ACT IN AEPB: 1.0000 | INHALATION_SPRAY | Freq: Two times a day (BID) | RESPIRATORY_TRACT | 5 refills | Status: DC

## 2023-06-13 NOTE — Telephone Encounter (Signed)
 Pt is scheduled for tomorrow morning!

## 2023-06-13 NOTE — Telephone Encounter (Signed)
 Copied from CRM [id number]. Topic: Clinical - Red Word Triage >> Jun 13, 2023  9:24 AM Franchot Heidelberg wrote: Red Word that prompted transfer to Nurse Triage: Pt has possible sinus infection symptoms, says there is blood when he blows his nose   Chief Complaint: Sinus Pain  Symptoms: Headache, Runny Nose, Right Earache  Frequency: Since Sunday  Pertinent Negatives: Patient denies fever, dyspnea, chest pain, or dizziness.  Disposition: [] ED /[] Urgent Care (no appt availability in office) / [x] Appointment(In office/virtual)/ []  [place] [facility]/ [] Home Care/ [] Refused Recommended Disposition /[] [place] [facility]/ []  Follow-up with PCP  Additional Notes: Patient contacted via phone regarding concerns of Sinus pain, pressure, and bloody nasal discharge. Discussed symptoms, severity, and duration. Based on assessment, patient was advised to see PCP in office. Scheduled patient per protocol on 06/14/2023. Patient verbalized understanding and agreement with plan. Documentation provided.     Reason for Disposition  Earache  Answer Assessment - Initial Assessment Questions 1. LOCATION: "Where does it hurt?"      [place]  2. ONSET: "When did the sinus pain start?"  (e.g., hours, days)      Since Monday  3. SEVERITY: "How bad is the pain?"   (Scale 1-10; mild, moderate or severe)   - MILD (1-3): doesn't interfere with normal activities    - MODERATE (4-7): interferes with normal activities (e.g., work or school) or awakens from sleep   - SEVERE (8-10): excruciating pain and patient unable to do any normal activities        8  4. RECURRENT SYMPTOM: "Have you ever had sinus problems before?" If Yes, ask: "When was the last time?" and "What happened that time?"      Yes  5. NASAL CONGESTION: "Is the nose blocked?" If Yes, ask: "Can you open it or must you breathe through your mouth?"     No  6. NASAL DISCHARGE: "Do you have discharge from your nose?" If so ask, "What color?"     Bloody  Discharge  7. FEVER: "Do you have a fever?" If Yes, ask: "What is it, how was it measured, and when did it start?"      No  8. OTHER SYMPTOMS: "Do you have any other symptoms?" (e.g., sore throat, cough, earache, difficulty breathing)     Headache, Runny Nose, Cough, Right Earache  Protocols used: Sinus Pain or Congestion-A-AH

## 2023-06-13 NOTE — Telephone Encounter (Signed)
 Copied from CRM [id number]. Topic: Clinical - Medication Refill >> Jun 13, 2023  9:21 AM Franchot Heidelberg wrote: Most Recent Primary Care Visit:  Provider: Lynelle Doctor, EVE  Department: [facility]  Visit Type: ACUTE  Date: 05/10/2023  Medication: methylphenidate (RITALIN LA) 30 MG 24 hr capsule   Pt called and is requesting to have this Rx sent to [facility] instead of [facility] because they do not have this in stock. Please advise   Has the patient contacted their pharmacy? Yes (Agent: If no, request that the patient contact the pharmacy for the refill. If patient does not wish to contact the pharmacy document the reason why and proceed with request.) (Agent: If yes, when and what did the pharmacy advise?)  Is this the correct pharmacy for this prescription? Yes If no, delete pharmacy and type the correct one.  This is the patient's preferred pharmacy:   [facility] [address], [place] - [address] [place] [address] Phone: [phone] Fax: [phone]   Has the prescription been filled recently? Yes  Is the patient out of the medication? Yes  Has the patient been seen for an appointment in the last year OR does the patient have an upcoming appointment? Yes  Can we respond through MyChart? Yes  Agent: Please be advised that Rx refills may take up to 3 business days. We ask that you follow-up with your pharmacy.

## 2023-06-13 NOTE — Patient Instructions (Signed)
 Severe persistent asthma - symptomatic in the winter --CONTINUE Advair 500-50 mcg ONE puff in the morning and evening. Rinse mouth out after use. --Ok to decrease to once a day during non-allergy seasons  Nasal congestion --START atrovent nasal spray 2 sprays per nostril up to four times as needed

## 2023-06-13 NOTE — Progress Notes (Signed)
 Subjective:   PATIENT ID: Jesse Hardin GENDER: male DOB: 05/12/1960, MRN: [id number]   HPI  Chief Complaint  Patient presents with   Follow-up    Asthma has been bad recently but expected with season changes. Albuterol rescue was no good so he is not using it.     Reason for Visit: Follow-up  [name] is a 63 year old male with GERD, hiatal hernia, s/p esophageal dilation x 2 in 2022 who presents for follow-up asthma  Initial consult He reports allergies for month that will trigger his cough however this year he has had a cough for 6 months with no trigger that has persisted. He will have nonproductive coughing spells that are severe and can last 10-15 min. Reports wheezing 1-2 x week during the daytime. Has coughing that will awaken him. Treated with Tussionex with improvement in symptoms and has allowed him to sleep. Allegra and zyrtec was not effective. Denies nasal congestion. On protonix 40 mg twice daily  12/27/21 He reports compliance with Breo 200 daily. Not effective. Maybe some benefit in cough in the mornings but worsening symptoms during the afternoon. Not able to get the breaths that he needs. He is no longer wheezing. He wakes up nightly with coughing spells that will lead to gagging. Mucinex is ineffective.  02/27/22 Since our last visit he was transitioned from [facility] to Advair 500. He was recently seen on 11/71/23 by NP Arvilla Market in [facility] for sinus pressure and nasal congestion and treated with acute bacterial sinusitis with augmentin and tessalon perles. Prior to illness he reports near resolution of cough. Medications has helped as well as isolation at his workplace with separate office with door. No shortness of breath or cough.  11/17/22 Since our last visit he is no longer taking Advair. Using it once every 2 weeks he will need it for 2-3 days. He now works in a separate office with an air filter and felt this has made the biggest impact. Fall  and winter is when his worst times for him.   06/13/23 Since winter his allergies with nasal drainage are starting to worsen and is affecting his asthma and starting to cough and gag at times. Occasional wheezing. He has started taking Advair 500 once a day. Will start to act up by the evening. Symptoms awaken him at night. Will awaken with daytime headaches.  Asthma Control Test ACT Total Score  06/13/2023  4:12 PM 15   Social History: Never smoker   Past Medical History:  Diagnosis Date   Allergy    Anxiety    Bipolar disorder (HCC)    "quick cycler", hx/o manic episodes if off medication   Colon polyps 06/26/2011   Ascending colon   Depression    Diverticulitis of colon 06/26/2011   per colonoscopy   GERD (gastroesophageal reflux disease)    Grade I internal hemorrhoids 06/26/2011   colonscopy.    HLD (hyperlipidemia)    Insomnia    Migraine    Osteoporosis    Pneumonia    Sexual dysfunction      Family History  Problem Relation Age of Onset   Depression Mother    Heart disease Father 39       CABG   Depression Sister    Depression Sister    Cancer Sister        breast lesions   Cancer Paternal Grandmother        type unknown, ? lung, heavy smoker  Stroke Neg Hx    Diabetes Neg Hx    Colon cancer Neg Hx    Rectal cancer Neg Hx    Stomach cancer Neg Hx      Social History   Occupational History   Occupation: Chiropractor  Tobacco Use   Smoking status: Never   Smokeless tobacco: Never  Vaping Use   Vaping status: Never Used  Substance and Sexual Activity   Alcohol use: Not Currently   Drug use: No   Sexual activity: Not on file    No Known Allergies   Outpatient Medications Prior to Visit  Medication Sig Dispense Refill   b complex vitamins capsule Take 1 capsule by mouth daily. 30 capsule 1   buPROPion (WELLBUTRIN XL) 300 MG 24 hr tablet Take 1 tablet (300 mg total) by mouth daily. 90 tablet 1   Cetirizine HCl (ZYRTEC PO) Take by mouth.  Alternating every other day with allegra     Cholecalciferol (VITAMIN D3) 50 MCG (2000 UT) capsule TAKE 1 CAPSULE BY MOUTH EVERY DAY 90 capsule 1   FEXOFENADINE HCL PO Take by mouth. Every other day     neomycin-polymyxin-hydrocortisone (CORTISPORIN) OTIC solution Place 4 drops into the left ear 4 (four) times daily. 10 mL 0   pantoprazole (PROTONIX) 40 MG tablet Take 40 mg by mouth daily.     rosuvastatin (CRESTOR) 10 MG tablet TAKE 1 TABLET BY MOUTH EVERY DAY 90 tablet 0   Testosterone 20.25 MG/ACT (1.62%) GEL APPLY 3 SQUIRTS ONTO THE SKIN DAILY 75 g 2   fluticasone-salmeterol (ADVAIR) 500-50 MCG/ACT AEPB Inhale 1 puff into the lungs in the morning and at bedtime. 60 each 5   methylphenidate (RITALIN LA) 30 MG 24 hr capsule Take 1 capsule (30 mg total) by mouth every morning. 30 capsule 0   methylphenidate (RITALIN LA) 30 MG 24 hr capsule Take 1 capsule (30 mg total) by mouth every morning. 30 capsule 0   methylphenidate (RITALIN LA) 30 MG 24 hr capsule Take 1 capsule (30 mg total) by mouth every morning. 30 capsule 0   No facility-administered medications prior to visit.    Review of Systems  Constitutional:  Negative for chills, diaphoresis, fever, malaise/fatigue and weight loss.  HENT:  Positive for congestion.   Respiratory:  Positive for cough, shortness of breath and wheezing. Negative for hemoptysis and sputum production.   Cardiovascular:  Negative for chest pain, palpitations and leg swelling.     Objective:   Vitals:   06/13/23 1611  BP: 130/76  Pulse: 90  Resp: 18  SpO2: 96%  Weight: 183 lb 4.8 oz (83.1 kg)  Height: 5\' 8"  (1.727 m)   SpO2: 96 %  Physical Exam: General: Well-appearing, no acute distress HENT: [place], AT Eyes: EOMI, no scleral icterus Respiratory: Clear to auscultation bilaterally.  No crackles, wheezing or rales Cardiovascular: RRR, -M/R/G, no JVD Extremities:-Edema,-tenderness Neuro: AAO x4, CNII-XII grossly intact Psych: Normal mood, normal  affect  Data Reviewed:  Imaging: CXR 08/15/21 - Normal. No infiltrate, effusion or edema  PFT: 09/23/21 FVC 3.6 (81%) FEV1 2.7 (80%) Ratio 76   Interpretation: Normal spirometry  11/18/21 Methacholine challenge positive for hyperreactive airways with FEV1 reduction by 26% at 16 mg/ml.  Labs: CBC    Component Value Date/Time   WBC 8.2 06/01/2022 0948   WBC 6.2 01/26/2017 0845   RBC 5.10 06/01/2022 0948   RBC 4.94 01/26/2017 0845   HGB 13.2 06/01/2022 0948   HCT 42.4 06/01/2022 0948  PLT 316 06/01/2022 0948   MCV 83 06/01/2022 0948   MCH 25.9 (L) 06/01/2022 0948   MCH 29.6 01/26/2017 0845   MCHC 31.1 (L) 06/01/2022 0948   MCHC 34.2 01/26/2017 0845   RDW 16.1 (H) 06/01/2022 0948   LYMPHSABS 3.6 (H) 06/01/2022 0948   EOSABS 0.2 06/01/2022 0948   BASOSABS 0.0 06/01/2022 0948   Absolute eos 09/03/18 - 100     Assessment & Plan:   Discussion: 63 year old male with GERD, hiatal hernia s/p esophageal dilation x 2 in 2002 who presents for follow-up. Increased symptoms and on Advair daily. Advised to increase to twice daily and add nasal spray to help with congestion for symptom control. Discussed clinical course and management of asthma including bronchodilator regimen and action plan for exacerbation.  Severe persistent asthma - symptomatic in the winter --CONTINUE Advair 500-50 mcg ONE puff in the morning and evening. Rinse mouth out after use. --Ok to decrease to once a day during non-allergy seasons  Nasal congestion --START atrovent nasal spray 2 sprays per nostril up to four times as needed  Hx esophageal dilation, hiatal hernia --CONTINUE protonix 40 mg twice daily  Health Maintenance Immunization History  Administered Date(s) Administered   Influenza Inj Mdck Quad Pf 01/21/2017   Influenza Split 02/15/2009   Influenza, Seasonal, Injecte, Preservative Fre 12/01/2013, 02/16/2015, 01/04/2023   Influenza,inj,Quad PF,6+ Mos 02/19/2018, 01/04/2021, 12/08/2021    Influenza-Unspecified 12/08/2017, 01/08/2019, 02/07/2020   Janssen (J&J) SARS-COV-2 Vaccination 06/13/2019   PFIZER(Purple Top)SARS-COV-2 Vaccination 01/26/2020   Pfizer Covid-19 Vaccine Bivalent Booster 52yrs & up 01/04/2021   Pneumococcal Conjugate-13 12/08/2021   Tdap 04/07/2013   Unspecified SARS-COV-2 Vaccination 01/07/2022   CT Lung Screen - never smoker  No orders of the defined types were placed in this encounter.  Meds ordered this encounter  Medications   ipratropium (ATROVENT) 0.06 % nasal spray    Sig: Place 2 sprays into both nostrils 4 (four) times daily.    Dispense:  15 mL    Refill:  2   fluticasone-salmeterol (ADVAIR) 500-50 MCG/ACT AEPB    Sig: Inhale 1 puff into the lungs in the morning and at bedtime.    Dispense:  60 each    Refill:  5    Return in about 7 months (around 01/13/2024).  I have spent a total time of 33-minutes on the day of the appointment including chart review, data review, collecting history, coordinating care and discussing medical diagnosis and plan with the patient/family. Past medical history, allergies, medications were reviewed. Pertinent imaging, labs and tests included in this note have been reviewed and interpreted independently by me.  Jadia Capers Mechele Collin, MD [facility] 06/13/2023 4:52 PM

## 2023-06-14 ENCOUNTER — Ambulatory Visit (INDEPENDENT_AMBULATORY_CARE_PROVIDER_SITE_OTHER): Admitting: Medical

## 2023-06-14 VITALS — BP 110/70 | HR 57 | Wt 183.8 lb

## 2023-06-14 DIAGNOSIS — R0982 Postnasal drip: Secondary | ICD-10-CM | POA: Diagnosis not present

## 2023-06-14 DIAGNOSIS — G43909 Migraine, unspecified, not intractable, without status migrainosus: Secondary | ICD-10-CM | POA: Diagnosis not present

## 2023-06-14 DIAGNOSIS — J3489 Other specified disorders of nose and nasal sinuses: Secondary | ICD-10-CM | POA: Diagnosis not present

## 2023-06-14 LAB — POC COVID19 BINAXNOW: SARS Coronavirus 2 Ag: NEGATIVE

## 2023-06-14 LAB — POCT INFLUENZA A/B
Influenza A, POC: NEGATIVE
Influenza B, POC: NEGATIVE

## 2023-06-14 MED ORDER — UBRELVY 50 MG PO TABS: 1.0000 | ORAL_TABLET | Freq: Every day | ORAL | Status: DC | PRN

## 2023-06-14 MED ORDER — AMOXICILLIN 875 MG PO TABS: 875.0000 mg | ORAL_TABLET | Freq: Two times a day (BID) | ORAL | 0 refills | Status: AC

## 2023-06-14 NOTE — Patient Instructions (Signed)
 Sinus pressure, postnasal drainage Continue good hydration with water throughout the day Continue another 4-5 days of Mucinex OTC twice daily Continue your new Atrovent Consider daily allergy pill such as Zyrtec during allergy season If worse sinus pressure, headache and mucous through weekend, and not improving then begin antibiotic Amoxicillin sent to pharmacy  Migraine Thankfully you do not get these a lot I do not want you doubling up on anti-inflammatories like you are doing when you get a bad migraine Begin trial of Bernita Raisin which is for abortive therapy to help treat an acute migraine.  I gave you 2 samples today, 50mg  and 100mg .   The next time you get a "migraine" begin trial of 50mg  Ubrelvy.   If headache not improved within 2 hours, you can add Excedrin over the counter, or tylenol up to 500mg  or Ibuprofen 200mg , up to 3 tablets or 600mg  every 6-8 hours if needed The next migraine you get, you can try the higher dose 100mg  Ubrelvy. Rest, hydrate well, and limit alcohol and caffeine Let me know in a month how these worked for you

## 2023-06-14 NOTE — Progress Notes (Signed)
 Subjective:  Jesse Hardin is a 63 y.o. male who presents for Chief Complaint  Patient presents with   Sinus Problem    Sinus pain,pressure and congestion since last Sunday. No other symptoms     Here for headache and sinus issue.  For the last several days he has had sinus pressure, headache in middle of night, lots of pressure in head, and yesterday morning had a lot of bloody mucous.  Thinks he has sinus infection.  He has not had a lot of mucus production until yesterday.  No sore throat, but some post nasal drainage.  No fever, but has had some body aches and chills.   Using some mucinex last 2 days.    He saw his pulmonologist yesterday.  In addition to talking about his cough and lungs, he was started on Atrovent to help with nasal congestion.  He had a bad migraine a few days ago.  He does not get frequent migraines but when he gets them he usually uses 2 doses of Naprosyn and 2 doses of ibuprofen and 2 doses of Tylenol at the same time  He feels like his sinus pressure triggered this migraine.  He does get seasonal allergies in the spring as well  No numbness, tingling, vision changes, weakness or other worrisome headache symptoms.  No other aggravating or relieving factors.    No other c/o.  Past Medical History:  Diagnosis Date   Allergy    Anxiety    Bipolar disorder (HCC)    "quick cycler", hx/o manic episodes if off medication   Colon polyps 06/26/2011   Ascending colon   Depression    Diverticulitis of colon 06/26/2011   per colonoscopy   GERD (gastroesophageal reflux disease)    Grade I internal hemorrhoids 06/26/2011   colonscopy.    HLD (hyperlipidemia)    Insomnia    Migraine    Osteoporosis    Pneumonia    Sexual dysfunction    Current Outpatient Medications on File Prior to Visit  Medication Sig Dispense Refill   b complex vitamins capsule Take 1 capsule by mouth daily. 30 capsule 1   buPROPion (WELLBUTRIN XL) 300 MG 24 hr tablet Take 1 tablet  (300 mg total) by mouth daily. 90 tablet 1   Cetirizine HCl (ZYRTEC PO) Take by mouth. Alternating every other day with allegra     Cholecalciferol (VITAMIN D3) 50 MCG (2000 UT) capsule TAKE 1 CAPSULE BY MOUTH EVERY DAY 90 capsule 1   FEXOFENADINE HCL PO Take by mouth. Every other day     fluticasone-salmeterol (ADVAIR) 500-50 MCG/ACT AEPB Inhale 1 puff into the lungs in the morning and at bedtime. 60 each 5   ipratropium (ATROVENT) 0.06 % nasal spray Place 2 sprays into both nostrils 4 (four) times daily. 15 mL 2   methylphenidate (RITALIN LA) 30 MG 24 hr capsule Take 1 capsule (30 mg total) by mouth every morning. 30 capsule 0   pantoprazole (PROTONIX) 40 MG tablet Take 40 mg by mouth daily.     rosuvastatin (CRESTOR) 10 MG tablet TAKE 1 TABLET BY MOUTH EVERY DAY 90 tablet 0   Testosterone 20.25 MG/ACT (1.62%) GEL APPLY 3 SQUIRTS ONTO THE SKIN DAILY 75 g 2   No current facility-administered medications on file prior to visit.   The following portions of the patient's history were reviewed and updated as appropriate: allergies, current medications, past family history, past medical history, past social history, past surgical history and problem list.  ROS  Otherwise as in subjective above    Objective: BP 110/70   Pulse (!) 57   Wt 183 lb 12.8 oz (83.4 kg)   BMI 27.95 kg/m   General appearance: alert, no distress, well developed, well nourished HEENT: normocephalic, sclerae anicteric, conjunctiva pink and moist, TMs pearly, nares with some turbinate edema and dry mucoid discharge, some erythema, pharynx with some postnasal drainage  oral cavity: MMM, no lesions Neck: supple, no lymphadenopathy, no thyromegaly, no masses Heart: RRR, normal S1, S2, no murmurs Lungs: CTA bilaterally, no wheezes, rhonchi, or rales Pulses: 2+ radial pulses, 2+ pedal pulses, normal cap refill Ext: no edema   Assessment: Encounter Diagnoses  Name Primary?   Sinus pressure Yes   Migraine without  status migrainosus, not intractable, unspecified migraine type    Post-nasal drainage      Plan: Sinus pressure, postnasal drainage Continue good hydration with water throughout the day Continue another 4-5 days of Mucinex OTC twice daily Continue your new Atrovent Consider daily allergy pill such as Zyrtec during allergy season If worse sinus pressure, headache and mucous through weekend, and not improving then begin antibiotic Amoxicillin sent to pharmacy  Migraine Thankfully you do not get these a lot I do not want you doubling up on anti-inflammatories like you are doing when you get a bad migraine Begin trial of Jesse Hardin which is for abortive therapy to help treat an acute migraine.  I gave you 2 samples today, 50mg  and 100mg .   The next time you get a "migraine" begin trial of 50mg  Jesse Hardin.   If headache not improved within 2 hours, you can add Excedrin over the counter, or tylenol up to 500mg  or Ibuprofen 200mg , up to 3 tablets or 600mg  every 6-8 hours if needed The next migraine you get, you can try the higher dose 100mg  Jesse Hardin. Rest, hydrate well, and limit alcohol and caffeine Let me know in a month how these worked for you   [employer]" was seen today for sinus problem.  Diagnoses and all orders for this visit:  Sinus pressure -     POC COVID-19 -     POCT Influenza A/B  Migraine without status migrainosus, not intractable, unspecified migraine type  Post-nasal drainage  Other orders -     amoxicillin (AMOXIL) 875 MG tablet; Take 1 tablet (875 mg total) by mouth 2 (two) times daily for 10 days. -     Ubrogepant (Jesse Hardin) 50 MG TABS; Take 1 tablet (50 mg total) by mouth daily as needed.    Follow up: prn

## 2023-06-22 ENCOUNTER — Encounter: Payer: Self-pay | Admitting: Physical Therapy

## 2023-06-22 ENCOUNTER — Ambulatory Visit: Admitting: Physical Therapy

## 2023-06-22 ENCOUNTER — Other Ambulatory Visit: Payer: Self-pay | Admitting: Medical

## 2023-06-22 DIAGNOSIS — M25511 Pain in right shoulder: Secondary | ICD-10-CM | POA: Diagnosis not present

## 2023-06-22 MED ORDER — PANTOPRAZOLE SODIUM 40 MG PO TBEC: 40.0000 mg | DELAYED_RELEASE_TABLET | Freq: Every day | ORAL | 0 refills | Status: DC
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
## 2023-06-25 ENCOUNTER — Ambulatory Visit: Admitting: Physical Therapy

## 2023-06-25 ENCOUNTER — Encounter: Payer: Self-pay | Admitting: Physical Therapy

## 2023-06-25 DIAGNOSIS — M25511 Pain in right shoulder: Secondary | ICD-10-CM | POA: Diagnosis not present
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
## 2023-07-02 ENCOUNTER — Encounter: Payer: Self-pay | Admitting: Physical Therapy

## 2023-07-02 ENCOUNTER — Ambulatory Visit: Admitting: Physical Therapy

## 2023-07-02 DIAGNOSIS — M25511 Pain in right shoulder: Secondary | ICD-10-CM | POA: Diagnosis not present
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
## 2023-07-11 ENCOUNTER — Encounter: Admitting: Physical Therapy

## 2023-07-13 ENCOUNTER — Other Ambulatory Visit: Payer: Self-pay | Admitting: Medical

## 2023-07-13 MED ORDER — METHYLPHENIDATE HCL ER (LA) 30 MG PO CP24: 30.0000 mg | ORAL_CAPSULE | ORAL | 0 refills | Status: DC

## 2023-07-13 MED ORDER — VITAMIN D3 50 MCG (2000 UT) PO CAPS: 2000.0000 [IU] | ORAL_CAPSULE | Freq: Every day | ORAL | 1 refills | Status: DC

## 2023-07-19 ENCOUNTER — Encounter: Payer: Self-pay | Admitting: Physical Therapy

## 2023-07-19 ENCOUNTER — Ambulatory Visit: Admitting: Physical Therapy

## 2023-07-19 DIAGNOSIS — M25511 Pain in right shoulder: Secondary | ICD-10-CM
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
## 2023-07-27 ENCOUNTER — Ambulatory Visit: Admitting: Physical Therapy

## 2023-07-27 ENCOUNTER — Encounter: Payer: Self-pay | Admitting: Physical Therapy

## 2023-07-27 DIAGNOSIS — M25511 Pain in right shoulder: Secondary | ICD-10-CM | POA: Diagnosis not present
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
## 2023-07-31 ENCOUNTER — Ambulatory Visit: Admitting: Family Medicine

## 2023-08-03 ENCOUNTER — Other Ambulatory Visit: Payer: Self-pay | Admitting: Internal Medicine

## 2023-08-03 NOTE — Progress Notes (Signed)
 Ordered Prolia   [facility] for every 6 months

## 2023-08-06 ENCOUNTER — Telehealth: Payer: Self-pay

## 2023-08-06 NOTE — Telephone Encounter (Signed)
 Jesse Hardin and Jesse Hardin, patient will be scheduled as soon as possible.  Auth Submission: NO AUTH NEEDED Site of care: Site of care: [facility] Payer: UHC commercial Medication & CPT/J Code(s) submitted: Prolia  (Denosumab ) [id number] Route of submission (phone, fax, portal): portal and phone Phone # [phone] Fax # Auth type: Buy/Bill PB Units/visits requested: 60mg  x 2 doses Reference number: Portal ref #[id number]                                 Phone ref #[id number] Approval from: 08/06/23 to 04/02/24

## 2023-08-09 ENCOUNTER — Ambulatory Visit: Admitting: Family Medicine

## 2023-08-09 ENCOUNTER — Encounter: Payer: Self-pay | Admitting: Family Medicine

## 2023-08-09 VITALS — BP 136/86 | Ht 68.0 in | Wt 178.0 lb

## 2023-08-09 DIAGNOSIS — M12811 Other specific arthropathies, not elsewhere classified, right shoulder: Secondary | ICD-10-CM | POA: Diagnosis not present

## 2023-08-09 NOTE — Progress Notes (Signed)
 DATE OF VISIT: 08/09/2023        Peri Brackett DOB: Sep 20, 1960 MRN: [id number]  CC:  f/u Rt shoulder  History of present Illness: Keymon Sandahl is a 63 y.o. male who presents for a follow-up visit PT has helped with night-time pain - no PT in last week Still having trouble with certain positions - trouble last night reaching to put down glass of water on the table No meds for this Previously seen by a surgeon who advised will likely need a shoulder replacement, he was trying to put this off.  Now starting to think he would like to see a surgeon - His partner recently saw Dr. Deeann Fare at [facility], would be interested in referral to him  Medications:  Outpatient Encounter Medications as of 08/09/2023  Medication Sig   b complex vitamins capsule Take 1 capsule by mouth daily.   buPROPion  (WELLBUTRIN  XL) 300 MG 24 hr tablet Take 1 tablet (300 mg total) by mouth daily.   Cetirizine HCl (ZYRTEC PO) Take by mouth. Alternating every other day with allegra   Cholecalciferol (VITAMIN D3) 50 MCG (2000 UT) capsule Take 1 capsule (2,000 Units total) by mouth daily.   FEXOFENADINE HCL PO Take by mouth. Every other day   fluticasone -salmeterol (ADVAIR) 500-50 MCG/ACT AEPB Inhale 1 puff into the lungs in the morning and at bedtime.   ipratropium (ATROVENT ) 0.06 % nasal spray Place 2 sprays into both nostrils 4 (four) times daily.   methylphenidate  (RITALIN  LA) 30 MG 24 hr capsule Take 1 capsule (30 mg total) by mouth every morning.   pantoprazole  (PROTONIX ) 40 MG tablet Take 1 tablet (40 mg total) by mouth daily.   rosuvastatin  (CRESTOR ) 10 MG tablet TAKE 1 TABLET BY MOUTH EVERY DAY   Testosterone  20.25 MG/ACT (1.62%) GEL APPLY 3 SQUIRTS ONTO THE SKIN DAILY   Ubrogepant  (UBRELVY ) 50 MG TABS Take 1 tablet (50 mg total) by mouth daily as needed.   No facility-administered encounter medications on file as of 08/09/2023.    Allergies: has no known allergies.  Physical  Examination: Vitals: BP 136/86   Ht 5\' 8"  (1.727 m)   Wt 178 lb (80.7 kg)   BMI 27.06 kg/m  GENERAL:  Nekko Coslow is a 63 y.o. male appearing their stated age, alert and oriented x 3, in no apparent distress.  SKIN: no rashes or lesions, skin clean, dry, intact MSK: Right shoulder with near full range of motion with positive painful arc.  Tender palpation along the bicipital groove and greater tuberosity.  Positive empty can, negative drop arm, positive Hawkins, positive Neer.  Rotator cuff strength 3-/5 with flexion and abduction, 3+/5 with external rotation, 4+/5 with internal rotation.  Left shoulder with full range of motion without pain, weakness, instability Neurovascular intact distally  Radiology: Complete MSK US  of Right Shoulder Date: 04/26/23 IMPRESSION: Chronic full-thickness supraspinatus tear and atrophy, partial thickness chronic subscapularis and infraspinatus tearing noted as well.   X-rays right shoulder from 09/2018 showing: - 3 old metallic anchors in humeral head and high riding humerus, though no significant degenerative arthritic changes.   MRI from 10/2018 reviewed which showed: 1. Large high-grade supraspinatus and infraspinatus tendon tears. Small subscapularis tendon tear.  2.  High riding humeral head.  3.  Rotator cuff muscle belly atrophy predominantly involving the infraspinatus and superior subscapularis.   Assessment & Plan Rotator cuff arthropathy of right shoulder Ongoing right shoulder pain with known underlying rotator cuff arthropathy.  Only slight improvement with recent trial  of updated physical therapy.  Still having significant dysfunction and now starting to affect his activities of daily living  Plan: - Patient has not responded to conservative therapies.  Was told in the past he would likely need surgical intervention with shoulder replacement.  He is interested in pursuing this at this time. - Referral to Dr. Deeann Fare at [facility] was placed.  Patient notes his partner had recently seen him and would like to see him as well - He can continue his home exercise program as he is doing - Will follow-up with us  on an as-needed basis.  Can reach out with any questions or concerns after seen by Dr. Deeann Fare    Patient expressed understanding & agreement with above.  Encounter Diagnosis  Name Primary?   Rotator cuff arthropathy of right shoulder Yes    Orders Placed This Encounter  Procedures   Ambulatory referral to Orthopedic Surgery

## 2023-08-09 NOTE — Patient Instructions (Signed)
 We are referring you to Dr. Deeann Fare for your right shoulder. Their office will call you to schedule an appt. Please let us  know if you don't hear from them within a week.  [facility] [address] [phone]

## 2023-08-15 ENCOUNTER — Encounter: Payer: Self-pay | Admitting: Medical

## 2023-08-15 ENCOUNTER — Other Ambulatory Visit (HOSPITAL_COMMUNITY): Payer: Self-pay

## 2023-08-15 ENCOUNTER — Other Ambulatory Visit: Payer: Self-pay | Admitting: Medical

## 2023-08-15 ENCOUNTER — Telehealth: Payer: Self-pay

## 2023-08-15 NOTE — Telephone Encounter (Signed)
 Pharmacy Patient Advocate Encounter   Received notification from Physician's Office that prior authorization for Methylphenidate  HCl ER (LA) 30MG  er capsules is required/requested.   Insurance verification completed.   The patient is insured through [employer] .   Per test claim: PA required; PA submitted to above mentioned insurance via CoverMyMeds Key/confirmation #/EOC (Key: [id number])   Status is pending

## 2023-08-15 NOTE — Telephone Encounter (Signed)
 Pharmacy Patient Advocate Encounter  Received notification from [facility] that Prior Authorization for Methylphenidate  HCl ER (LA) 30MG  er capsules has been APPROVED from 4.14.25 to 5.14.26. Ran test claim, Copay is $12.00. This test claim was processed through [facility]- copay amounts may vary at other pharmacies due to pharmacy/plan contracts, or as the patient moves through the different stages of their insurance plan.   PA #/Case ID/Reference #: (Key: [id number])

## 2023-08-15 NOTE — Telephone Encounter (Signed)
 Please see previous encounter as the pt inquired about this p/a

## 2023-08-26 ENCOUNTER — Other Ambulatory Visit: Payer: Self-pay | Admitting: Medical

## 2023-08-28 NOTE — Telephone Encounter (Signed)
 Appt scheduled for October 2025

## 2023-09-11 ENCOUNTER — Telehealth: Payer: Self-pay | Admitting: *Deleted

## 2023-09-11 ENCOUNTER — Telehealth: Payer: Self-pay | Admitting: Medical

## 2023-09-11 NOTE — Telephone Encounter (Signed)
 We received a surgery clearance request which required labs and updated EKG and visit  Please schedule for a preop visit.  This may need to be with another provider since I am out of the office the next week and a half  Form is in my green folder on my desk

## 2023-09-11 NOTE — Telephone Encounter (Signed)
 Copied from CRM [id number]. Topic: Referral - Request for Referral >> Sep 11, 2023  2:53 PM Ilene Malling wrote: Maureen Sour from [facility] [phone] states has been trying to get medical clearance and fax to two different fax number numerous times and no one has called back. Maureen Sour wants to speak with a Production designer, theatre/television/film. Phone call dropped when waiting for CAL to contact the manager. Per CAL, [employer] is off and send to [facility], unable to send. Per CAL, send to admin pool and it will be passed to [employer].

## 2023-09-11 NOTE — Telephone Encounter (Signed)
 If patient calls back he just needs an appointment for a provider to fill out his surgical clearance form. If he needs it soon, he will need to see another provider in our office, if he does not need it within the next week he can be scheduled with his provider Nelda Balsam.

## 2023-09-12 NOTE — Telephone Encounter (Addendum)
 Surgical clearance form has been received from [facility]. Shoulder replacement is scheduled 09/25/2023. Patient last seen in office 06/13/2023. Office protocol is that patient has to be seen within 60 day of surgery. Maureen Sour with [facility] is aware that an appt is needed.   Tammy, please advise if okay to double book 09/13/2023 at [facility]?

## 2023-09-12 NOTE — Telephone Encounter (Signed)
 Copied from CRM [id number]. Topic: Clinical - Medical Advice >> Sep 10, 2023  4:41 PM Corean Deutscher wrote: Reason for CRM: Maureen Sour with [facility] called regarding Surgical clearence for this patient, she stated she has faxed and called for several weeks and the fax does not go through, patient is having surgery and needs a surgical clearence, the fax number is [phone].  ATC left a voicemail for [place] at [facility] for surgical clearance for patient to fax information to [phone].

## 2023-09-13 NOTE — Telephone Encounter (Signed)
 Chart says he has ov with Dr. Lucina Sabal next week .

## 2023-09-17 NOTE — Telephone Encounter (Signed)
 Pt is scheduled to see Dr Lucina Sabal on 09-18-23 at 2:30 pm.

## 2023-09-17 NOTE — Telephone Encounter (Signed)
 Noted. Thanks.

## 2023-09-18 ENCOUNTER — Ambulatory Visit: Admitting: Pulmonary Disease

## 2023-09-18 ENCOUNTER — Other Ambulatory Visit: Payer: Self-pay | Admitting: Medical

## 2023-09-18 VITALS — HR 90 | Ht 68.0 in | Wt 178.0 lb

## 2023-09-18 DIAGNOSIS — R0981 Nasal congestion: Secondary | ICD-10-CM

## 2023-09-18 DIAGNOSIS — J45909 Unspecified asthma, uncomplicated: Secondary | ICD-10-CM

## 2023-09-18 LAB — NITRIC OXIDE: Nitric Oxide: 23

## 2023-09-18 NOTE — Progress Notes (Addendum)
 Synopsis: Referred in by Claudene Crystal, PA-C   Subjective:   PATIENT ID: Jesse Hardin GENDER: male DOB: 1960/09/03, MRN: [id number]  Chief Complaint  Patient presents with   Surgical Clearance    HPI Jesse Hardin is a 63 year old male with GERD, hiatal hernia, s/p esophageal dilation x 2 in 2022 who presents for follow-up asthma   Initial consult He reports allergies for month that will trigger his cough however this year he has had a cough for 6 months with no trigger that has persisted. He will have nonproductive coughing spells that are severe and can last 10-15 min. Reports wheezing 1-2 x week during the daytime. Has coughing that will awaken him. Treated with Tussionex with improvement in symptoms and has allowed him to sleep. Allegra and zyrtec was not effective. Denies nasal congestion. On protonix  40 mg twice daily   12/27/21 He reports compliance with Breo 200 daily. Not effective. Maybe some benefit in cough in the mornings but worsening symptoms during the afternoon. Not able to get the breaths that he needs. He is no longer wheezing. He wakes up nightly with coughing spells that will lead to gagging. Mucinex is ineffective.   02/27/22 Since our last visit he was transitioned from Breo to Advair 500. He was recently seen on 11/71/23 by NP Monta Anton in [facility] for sinus pressure and nasal congestion and treated with acute bacterial sinusitis with augmentin  and tessalon  perles. Prior to illness he reports near resolution of cough. Medications has helped as well as isolation at his workplace with separate office with door. No shortness of breath or cough.   11/17/22 Since our last visit he is no longer taking Advair. Using it once every 2 weeks he will need it for 2-3 days. He now works in a separate office with an air filter and felt this has made the biggest impact. Fall and winter is when his worst times for him.    06/13/23 Since winter his allergies with  nasal drainage are starting to worsen and is affecting his asthma and starting to cough and gag at times. Occasional wheezing. He has started taking Advair 500 once a day. Will start to act up by the evening. Symptoms awaken him at night. Will awaken with daytime headaches.   09/18/2023 Jesse Hardin is here for surgical clearance for a right shoulder replacement scheduled next Tuesday. He has a history of intermittent asthma that flares during the winter. He uses advair scheduled during that period. He has not used his advair for the last couple of months. He denies any respiratory issues in the last couple of months. He did have GA in the past and did well. We discussed that he is low risk for post op pulmonary complications based on his history and ARISCAT score.   ROS All systems were reviewed and are negative except for the above.  Objective:   Vitals:   09/18/23 1423  Pulse: 90  SpO2: 97%  Weight: 178 lb (80.7 kg)  Height: 5' 8 (1.727 m)   97% on RA BMI Readings from Last 3 Encounters:  09/18/23 27.06 kg/m  08/09/23 27.06 kg/m  06/14/23 27.95 kg/m   Wt Readings from Last 3 Encounters:  09/18/23 178 lb (80.7 kg)  08/09/23 178 lb (80.7 kg)  06/14/23 183 lb 12.8 oz (83.4 kg)    Physical Exam GEN: NAD, Healthy Appearing HEENT: Supple Neck, Reactive Pupils, EOMI  CVS: Normal S1, Normal S2, RRR, No murmurs or ES  appreciated  Lungs: Clear bilateral air entry.  Abdomen: Soft, non tender, non distended, + BS  Extremities: Warm and well perfused, No edema  Skin: No suspicious lesions appreciated  Psych: Normal Affect  Ancillary Information   CBC    Component Value Date/Time   WBC 8.2 06/01/2022 0948   WBC 6.2 01/26/2017 0845   RBC 5.10 06/01/2022 0948   RBC 4.94 01/26/2017 0845   HGB 13.2 06/01/2022 0948   HCT 42.4 06/01/2022 0948   PLT 316 06/01/2022 0948   MCV 83 06/01/2022 0948   MCH 25.9 (L) 06/01/2022 0948   MCH 29.6 01/26/2017 0845   MCHC 31.1 (L)  06/01/2022 0948   MCHC 34.2 01/26/2017 0845   RDW 16.1 (H) 06/01/2022 0948   LYMPHSABS 3.6 (H) 06/01/2022 0948   EOSABS 0.2 06/01/2022 0948   BASOSABS 0.0 06/01/2022 0948    Imaging  Imaging: CXR 08/15/21 - Normal. No infiltrate, effusion or edema   PFT: 09/23/21 FVC 3.6 (81%) FEV1 2.7 (80%) Ratio 76   Interpretation: Normal spirometry   11/18/21 Methacholine  challenge positive for hyperreactive airways with FEV1 reduction by 26% at 16 mg/ml.    Latest Ref Rng & Units 11/18/2021   12:51 PM  PFT Results  FVC-Pre L 3.52   FVC-Predicted Pre % 77   FVC-Post L 3.68   FVC-Predicted Post % 81   Pre FEV1/FVC % % 73   Post FEV1/FCV % % 73   FEV1-Pre L 2.56   FEV1-Predicted Pre % 74   FEV1-Post L 2.68      Assessment & Plan:  63 year old male with GERD, hiatal hernia s/p esophageal dilation x 2 in 2002 who presents for follow-up. Increased symptoms and on Advair daily. Advised to increase to twice daily and add nasal spray to help with congestion for symptom control. Discussed clinical course and management of asthma including bronchodilator regimen and action plan for exacerbation.   Severe persistent asthma - symptomatic in the winter FENO 23 indeterminate for eosinophilic intrapulmonary inflammation.  -- Currently off Advair 500-50 mcg ONE puff in the morning and evening. Rinse mouth out after use.   Nasal congestion --c/w atrovent  nasal spray 2 sprays per nostril up to four times as needed   Hx esophageal dilation, hiatal hernia --CONTINUE protonix  40 mg twice daily  Surgical clearance for Shoulder replacement His asthma appears to be well controlled. We discussed that he is low risk for post op pulmonary complications based on his history. ARISCAT 3 indicating a low 1.6% risk of in hospital post op pulmonary complications and should preclude him from proceeding with this surgey that can improve his quality of life.   RTC 4 months with Dr. Washington Hacker   I spent 45 minutes caring  for this patient today, including preparing to see the patient, obtaining a medical history , reviewing a separately obtained history, performing a medically appropriate examination and/or evaluation, counseling and educating the patient/family/caregiver, documenting clinical information in the electronic health record, and independently interpreting results (not separately reported/billed) and communicating results to the patient/family/caregiver  Annitta Kindler, MD [facility] 09/18/2023 2:27 PM

## 2023-09-19 NOTE — Telephone Encounter (Signed)
 Note with risk assessment from Dr. Lucina Sabal 09/18/23 was faxed successfully to [facility].

## 2023-09-20 ENCOUNTER — Encounter: Payer: Self-pay | Admitting: Family Medicine

## 2023-09-20 ENCOUNTER — Ambulatory Visit: Admitting: Family Medicine

## 2023-09-20 ENCOUNTER — Telehealth: Payer: Self-pay

## 2023-09-20 VITALS — BP 120/86 | HR 94 | Wt 184.8 lb

## 2023-09-20 DIAGNOSIS — J4520 Mild intermittent asthma, uncomplicated: Secondary | ICD-10-CM | POA: Diagnosis not present

## 2023-09-20 DIAGNOSIS — Z01818 Encounter for other preprocedural examination: Secondary | ICD-10-CM

## 2023-09-20 NOTE — Telephone Encounter (Signed)
 Copied from CRM [id number]. Topic: Clinical - Request for Lab/Test Order >> Sep 20, 2023  9:10 AM Zipporah Him wrote: Reason for CRM: Patient requesting to get labs ordered, CBC and CMP, Dr Sherline Distel requests the labs for a surgery next Tuesday. Would like to come in and get them done this morning if he has to fast. Wants to know if that would be possible. Please follow up with patient per his request, he would like to know ASAP.  Talked to patient

## 2023-09-20 NOTE — Progress Notes (Signed)
   Subjective:    Patient ID: Jesse Hardin, male    DOB: Jun 14, 1960, 63 y.o.   MRN: [id number]  HPI He is here for preoperative exam prior to reverse right shoulder surgery scheduled on the 24th.  He was recently seen by pulmonary because of underlying asthma which seems only bother him in the winter.  He does use of Advair at that time.  His last EKG was in 2022.  He has had no chest pain, shortness of breath,   Review of Systems     Objective:    Physical Exam  Alert and in no distress. Tympanic membranes and canals are normal. Pharyngeal area is normal. Neck is supple without adenopathy or thyromegaly. Cardiac exam shows a regular sinus rhythm without murmurs or gallops. Lungs are clear to auscultation.       Assessment & Plan:  Mild intermittent asthma without complication - Plan: CBC with Differential/Platelet, Comprehensive metabolic panel with GFR  Preoperative examination - Plan: CBC with Differential/Platelet, Comprehensive metabolic panel with GFR He is cleared for surgery.

## 2023-09-21 ENCOUNTER — Ambulatory Visit: Payer: Self-pay | Admitting: Family Medicine

## 2023-09-21 LAB — COMPREHENSIVE METABOLIC PANEL WITH GFR
ALT: 35 IU/L (ref 0–44)
AST: 29 IU/L (ref 0–40)
Albumin: 4.5 g/dL (ref 3.9–4.9)
Alkaline Phosphatase: 57 IU/L (ref 44–121)
BUN/Creatinine Ratio: 17 (ref 10–24)
BUN: 20 mg/dL (ref 8–27)
Bilirubin Total: 0.3 mg/dL (ref 0.0–1.2)
CO2: 21 mmol/L (ref 20–29)
Calcium: 9.2 mg/dL (ref 8.6–10.2)
Chloride: 108 mmol/L — ABNORMAL HIGH (ref 96–106)
Creatinine, Ser: 1.21 mg/dL (ref 0.76–1.27)
Globulin, Total: 2.1 g/dL (ref 1.5–4.5)
Glucose: 91 mg/dL (ref 70–99)
Potassium: 4.3 mmol/L (ref 3.5–5.2)
Sodium: 143 mmol/L (ref 134–144)
Total Protein: 6.6 g/dL (ref 6.0–8.5)
eGFR: 68 mL/min/{1.73_m2} (ref >59)

## 2023-09-21 LAB — CBC WITH DIFFERENTIAL/PLATELET
Basophils Absolute: 0 10*3/uL (ref 0.0–0.2)
Basos: 0 %
EOS (ABSOLUTE): 0.2 10*3/uL (ref 0.0–0.4)
Eos: 2 %
Hematocrit: 44.6 % (ref 37.5–51.0)
Hemoglobin: 13.8 g/dL (ref 13.0–17.7)
Immature Grans (Abs): 0 10*3/uL (ref 0.0–0.1)
Immature Granulocytes: 0 %
Lymphocytes Absolute: 2.4 10*3/uL (ref 0.7–3.1)
Lymphs: 27 %
MCH: 27.8 pg (ref 26.6–33.0)
MCHC: 30.9 g/dL — ABNORMAL LOW (ref 31.5–35.7)
MCV: 90 fL (ref 79–97)
Monocytes Absolute: 1 10*3/uL — ABNORMAL HIGH (ref 0.1–0.9)
Monocytes: 11 %
Neutrophils Absolute: 5.3 10*3/uL (ref 1.4–7.0)
Neutrophils: 60 %
Platelets: 330 10*3/uL (ref 150–450)
RBC: 4.96 x10E6/uL (ref 4.14–5.80)
RDW: 13.8 % (ref 11.6–15.4)
WBC: 8.9 10*3/uL (ref 3.4–10.8)

## 2023-09-21 NOTE — Telephone Encounter (Signed)
 Copied from CRM [id number]. Topic: General - Other >> Sep 21, 2023 10:50 AM Baldomero Bone wrote: Reason for CRM: Maureen Sour at [facility] needs to get the last office visit notes for medical clearance for surgery. Callback number is [phone]  Notes faxed to [facility] at [facility]

## 2023-09-24 ENCOUNTER — Ambulatory Visit

## 2023-10-16 ENCOUNTER — Other Ambulatory Visit: Payer: Self-pay | Admitting: Medical

## 2023-10-16 MED ORDER — METHYLPHENIDATE HCL ER (LA) 30 MG PO CP24: 30.0000 mg | ORAL_CAPSULE | ORAL | 0 refills | Status: DC

## 2023-11-13 ENCOUNTER — Other Ambulatory Visit: Payer: Self-pay | Admitting: Medical

## 2023-11-13 MED ORDER — METHYLPHENIDATE HCL ER (LA) 30 MG PO CP24: 30.0000 mg | ORAL_CAPSULE | ORAL | 0 refills | Status: DC

## 2023-11-13 MED ORDER — ROSUVASTATIN CALCIUM 10 MG PO TABS: 10.0000 mg | ORAL_TABLET | Freq: Every day | ORAL | 1 refills | Status: DC

## 2023-11-23 ENCOUNTER — Telehealth: Admitting: Physician Assistant

## 2023-11-23 DIAGNOSIS — K529 Noninfective gastroenteritis and colitis, unspecified: Secondary | ICD-10-CM | POA: Diagnosis not present

## 2023-11-23 MED ORDER — ONDANSETRON 4 MG PO TBDP: 4.0000 mg | ORAL_TABLET | Freq: Three times a day (TID) | ORAL | 0 refills | Status: DC | PRN

## 2023-11-23 NOTE — Patient Instructions (Signed)
 Jesse Hardin, thank you for joining Delon CHRISTELLA Dickinson, PA-C for today's virtual visit.  While this provider is not your primary care provider (PCP), if your PCP is located in our provider database this encounter information will be shared with them immediately following your visit.   A [facility] MyChart account gives you access to today's visit and all your visits, tests, and labs performed at [facility]  click here if you don't have a [name] or go to [email or web address]  Consent: (Patient) Jesse Hardin provided verbal consent for this virtual visit at the beginning of the encounter.  Current Medications:  Current Outpatient Medications:    ondansetron  (ZOFRAN -ODT) 4 MG disintegrating tablet, Take 1-2 tablets (4-8 mg total) by mouth every 8 (eight) hours as needed., Disp: 20 tablet, Rfl: 0   b complex vitamins capsule, Take 1 capsule by mouth daily., Disp: 30 capsule, Rfl: 1   buPROPion  (WELLBUTRIN  XL) 300 MG 24 hr tablet, TAKE 1 TABLET BY MOUTH EVERY DAY, Disp: 90 tablet, Rfl: 1   Cetirizine HCl (ZYRTEC PO), Take by mouth. Alternating every other day with allegra, Disp: , Rfl:    Cholecalciferol (VITAMIN D3) 50 MCG (2000 UT) capsule, Take 1 capsule (2,000 Units total) by mouth daily., Disp: 90 capsule, Rfl: 1   FEXOFENADINE HCL PO, Take by mouth. Every other day, Disp: , Rfl:    fluticasone -salmeterol (ADVAIR) 500-50 MCG/ACT AEPB, Inhale 1 puff into the lungs in the morning and at bedtime., Disp: 60 each, Rfl: 5   ipratropium (ATROVENT ) 0.06 % nasal spray, Place 2 sprays into both nostrils 4 (four) times daily. (Patient not taking: Reported on 09/20/2023), Disp: 15 mL, Rfl: 2   methylphenidate  (RITALIN  LA) 30 MG 24 hr capsule, Take 1 capsule (30 mg total) by mouth every morning., Disp: 90 capsule, Rfl: 0   pantoprazole  (PROTONIX ) 40 MG tablet, TAKE 1 TABLET BY MOUTH EVERY DAY, Disp: 90 tablet, Rfl: 0   rosuvastatin  (CRESTOR ) 10 MG  tablet, Take 1 tablet (10 mg total) by mouth daily., Disp: 90 tablet, Rfl: 1   Testosterone  20.25 MG/ACT (1.62%) GEL, APPLY 3 SQUIRTS ONTO THE SKIN DAILY, Disp: 75 g, Rfl: 2   Ubrogepant  (UBRELVY ) 50 MG TABS, Take 1 tablet (50 mg total) by mouth daily as needed. (Patient not taking: Reported on 09/20/2023), Disp: , Rfl:    Medications ordered in this encounter:  Meds ordered this encounter  Medications   ondansetron  (ZOFRAN -ODT) 4 MG disintegrating tablet    Sig: Take 1-2 tablets (4-8 mg total) by mouth every 8 (eight) hours as needed.    Dispense:  20 tablet    Refill:  0    Supervising Provider:   LAMPTEY, PHILIP O [[id number]]     *If you need refills on other medications prior to your next appointment, please contact your pharmacy*  Follow-Up: Call back or seek an in-person evaluation if the symptoms worsen or if the condition fails to improve as anticipated.  [facility] [phone]  Other Instructions Gastroenteritis You will learn about gastroenteritis, or stomach flu, symptoms, treatment, and when to seek medical care. To view the content, go to this web address: [email or web address]  This video will expire on: 03/14/2025. If you need access to this video following this date, please reach out to the healthcare provider who assigned it to you. This information is not intended to replace advice given to you by your health care provider. Make sure you discuss any questions you have with your  health care provider. Elsevier Patient Education  2024 Elsevier Inc.   If you have been instructed to have an in-person evaluation today at a local Urgent Care facility, please use the link below. It will take you to a list of all of our available [facility], including address, phone number and hours of operation. Please do not delay care.  [facility]  If you or a family member do not have a primary care provider, use the link below to  schedule a visit and establish care. When you choose a [facility] primary care physician or advanced practice provider, you gain a long-term partner in health. Find a Primary Care Provider  Learn more about [facility] in-office and virtual care options: [facility] - Get Care Now

## 2023-11-23 NOTE — Progress Notes (Signed)
 Virtual Visit Consent   Jesse Hardin, you are scheduled for a virtual visit with a [facility] provider today. Just as with appointments in the office, your consent must be obtained to participate. Your consent will be active for this visit and any virtual visit you may have with one of our providers in the next 365 days. If you have a MyChart account, a copy of this consent can be sent to you electronically.  As this is a virtual visit, video technology does not allow for your provider to perform a traditional examination. This may limit your provider's ability to fully assess your condition. If your provider identifies any concerns that need to be evaluated in person or the need to arrange testing (such as labs, EKG, etc.), we will make arrangements to do so. Although advances in technology are sophisticated, we cannot ensure that it will always work on either your end or our end. If the connection with a video visit is poor, the visit may have to be switched to a telephone visit. With either a video or telephone visit, we are not always able to ensure that we have a secure connection.  By engaging in this virtual visit, you consent to the provision of healthcare and authorize for your insurance to be billed (if applicable) for the services provided during this visit. Depending on your insurance coverage, you may receive a charge related to this service.  I need to obtain your verbal consent now. Are you willing to proceed with your visit today? Jesse Hardin has provided verbal consent on 11/23/2023 for a virtual visit (video or telephone). Jesse Hardin, Jesse Hardin  Date: 11/23/2023 9:09 AM   Virtual Visit via Video Note   I, Jesse Hardin, connected with  Jesse Hardin  ([id number], January 16, 1961) on 11/23/23 at  9:00 AM EDT by a video-enabled telemedicine application and verified that I am speaking with the correct person using two identifiers.  Location: Patient: Virtual  Visit Location Patient: Home Provider: Virtual Visit Location Provider: Home Office   I discussed the limitations of evaluation and management by telemedicine and the availability of in person appointments. The patient expressed understanding and agreed to proceed.    History of Present Illness: Jesse Hardin is a 63 y.o. who identifies as a male who was assigned male at birth, and is being seen today for nausea and vomiting.  HPI: Emesis  This is a new problem. The current episode started in the past 7 days (Thursday, had raw oysters wednesday night). The problem occurs 2 to 4 times per day. The problem has been unchanged. The emesis has an appearance of stomach contents (dry heaving). Maximum temperature: 99.3. The fever has been present for Less than 1 day. Associated symptoms include abdominal pain, chills, a fever, headaches, myalgias and sweats. Pertinent negatives include no arthralgias, diarrhea, dizziness or weight loss. Risk factors include suspect food intake. He has tried increased fluids and sleep for the symptoms. The treatment provided no relief.     Problems:  Patient Active Problem List   Diagnosis Date Noted   Neck pain 10/04/2022   Decreased ROM of neck 10/04/2022   Radicular pain in left arm 10/04/2022   Elevated BP without diagnosis of hypertension 10/04/2022   Dacrocystitis, right 03/20/2022   Eczematous dermatitis of upper and lower eyelids of both eyes 03/20/2022   Cough variant asthma 12/08/2021   Hiatal hernia 09/23/2021   History of esophageal dilatation 09/23/2021   Tongue fissure 09/23/2021   Non-seasonal  allergic rhinitis 06/03/2021   Asymptomatic varicose veins of both lower extremities 05/27/2021   Osteoporosis without current pathological fracture 01/04/2021   Screening for prostate cancer 11/24/2020   Radiculitis 11/24/2020   Screening for heart disease 11/24/2020   Attention and concentration deficit 07/08/2020   Hyperlipidemia 07/08/2020    Hemorrhoids 03/30/2020   Diverticulosis 03/30/2020   Polyp of colon 03/30/2020   Vaccine counseling 06/20/2017   Encounter for health maintenance examination in adult 06/20/2017   Family history of heart disease 06/20/2017   Screen for colon cancer 06/20/2017   Generalized anxiety disorder 01/03/2016   Vitamin D  deficiency 11/16/2015   Bipolar disorder with depression (HCC) 11/01/2015   Insomnia 11/01/2015   Low testosterone  11/01/2015    Allergies: No Known Allergies Medications:  Current Outpatient Medications:    ondansetron  (ZOFRAN -ODT) 4 MG disintegrating tablet, Take 1-2 tablets (4-8 mg total) by mouth every 8 (eight) hours as needed., Disp: 20 tablet, Rfl: 0   b complex vitamins capsule, Take 1 capsule by mouth daily., Disp: 30 capsule, Rfl: 1   buPROPion  (WELLBUTRIN  XL) 300 MG 24 hr tablet, TAKE 1 TABLET BY MOUTH EVERY DAY, Disp: 90 tablet, Rfl: 1   Cetirizine HCl (ZYRTEC PO), Take by mouth. Alternating every other day with allegra, Disp: , Rfl:    Cholecalciferol (VITAMIN D3) 50 MCG (2000 UT) capsule, Take 1 capsule (2,000 Units total) by mouth daily., Disp: 90 capsule, Rfl: 1   FEXOFENADINE HCL PO, Take by mouth. Every other day, Disp: , Rfl:    fluticasone -salmeterol (ADVAIR) 500-50 MCG/ACT AEPB, Inhale 1 puff into the lungs in the morning and at bedtime., Disp: 60 each, Rfl: 5   ipratropium (ATROVENT ) 0.06 % nasal spray, Place 2 sprays into both nostrils 4 (four) times daily. (Patient not taking: Reported on 09/20/2023), Disp: 15 mL, Rfl: 2   methylphenidate  (RITALIN  LA) 30 MG 24 hr capsule, Take 1 capsule (30 mg total) by mouth every morning., Disp: 90 capsule, Rfl: 0   pantoprazole  (PROTONIX ) 40 MG tablet, TAKE 1 TABLET BY MOUTH EVERY DAY, Disp: 90 tablet, Rfl: 0   rosuvastatin  (CRESTOR ) 10 MG tablet, Take 1 tablet (10 mg total) by mouth daily., Disp: 90 tablet, Rfl: 1   Testosterone  20.25 MG/ACT (1.62%) GEL, APPLY 3 SQUIRTS ONTO THE SKIN DAILY, Disp: 75 g, Rfl: 2    Ubrogepant  (UBRELVY ) 50 MG TABS, Take 1 tablet (50 mg total) by mouth daily as needed. (Patient not taking: Reported on 09/20/2023), Disp: , Rfl:   Observations/Objective: Patient is well-developed, well-nourished in no acute distress.  Resting comfortably at home.  Head is normocephalic, atraumatic.  No labored breathing.  Speech is clear and coherent with logical content.  Patient is alert and oriented at baseline.    Assessment and Plan: 1. Gastroenteritis (Primary) - ondansetron  (ZOFRAN -ODT) 4 MG disintegrating tablet; Take 1-2 tablets (4-8 mg total) by mouth every 8 (eight) hours as needed.  Dispense: 20 tablet; Refill: 0  - Suspect gastroenteritis from raw oysters - Zofran  prescribed - Push fluids, electrolyte beverages - Liquid diet, then increase to soft/bland (BRAT) diet over next day, then increase diet as tolerated - Seek in person evaluation if not improving or symptoms worsen   Follow Up Instructions: I discussed the assessment and treatment plan with the patient. The patient was provided an opportunity to ask questions and all were answered. The patient agreed with the plan and demonstrated an understanding of the instructions.  A copy of instructions were sent to the patient via MyChart  unless otherwise noted below.    The patient was advised to call back or seek an in-person evaluation if the symptoms worsen or if the condition fails to improve as anticipated.    Jesse Hardin, Jesse Hardin

## 2023-11-26 ENCOUNTER — Encounter: Payer: Self-pay | Admitting: Family Medicine

## 2023-11-26 ENCOUNTER — Ambulatory Visit: Admitting: Family Medicine

## 2023-11-26 VITALS — BP 150/90 | HR 84 | Wt 175.0 lb

## 2023-11-26 DIAGNOSIS — N401 Enlarged prostate with lower urinary tract symptoms: Secondary | ICD-10-CM

## 2023-11-26 DIAGNOSIS — R3914 Feeling of incomplete bladder emptying: Secondary | ICD-10-CM | POA: Diagnosis not present

## 2023-11-26 DIAGNOSIS — R350 Frequency of micturition: Secondary | ICD-10-CM

## 2023-11-26 LAB — POCT URINE DIPSTICK
Bilirubin, UA: NEGATIVE
Glucose, UA: NEGATIVE mg/dL
Ketones, POC UA: NEGATIVE mg/dL
Leukocytes, UA: NEGATIVE
Nitrite, UA: NEGATIVE
POC PROTEIN,UA: 30 — AB
Spec Grav, UA: 1.02 (ref 1.010–1.025)
Urobilinogen, UA: 0.2 U/dL
pH, UA: 6 (ref 5.0–8.0)

## 2023-11-26 LAB — POCT GLYCOSYLATED HEMOGLOBIN (HGB A1C): Hemoglobin A1C: 5.7 % — AB (ref 4.0–5.6)

## 2023-11-26 MED ORDER — TAMSULOSIN HCL 0.4 MG PO CAPS
0.4000 mg | ORAL_CAPSULE | Freq: Every day | ORAL | 3 refills | Status: DC
Start: 2023-11-26 — End: 2024-01-09

## 2023-11-26 NOTE — Progress Notes (Signed)
 Name: Jesse Hardin   Date of Visit: 11/26/23   Date of last visit with me: Visit date not found   CHIEF COMPLAINT:  Chief Complaint  Patient presents with   Acute Visit    Possible UTI. Wakes him up every hour through out the night.        HPI:  Discussed the use of AI scribe software for clinical note transcription with the patient, who gave verbal consent to proceed.  History of Present Illness   Jesse Hardin is a 63 year old male who presents with urinary difficulties and recent symptoms suggestive of a viral illness.  He began experiencing severe headaches on Wednesday, initially thought to be migraines, which were managed with medication. By Thursday, he felt disoriented and developed chills and hot spells, worsening to the point of being unable to stand due to shivering by Thursday night. A telehealth consultation on Friday suggested a possible norovirus infection. The symptoms of chills and hot spells have since resolved.  On Friday afternoon, he began experiencing urinary difficulties, specifically problems with urination that worsened over time. By Friday night, he was waking up hourly with the urge to urinate but was unable to do so effectively. This pattern has persisted for the last three nights. During the day, urination is less problematic, but at night, he feels a strong urge to urinate without being able to fully empty his bladder. He describes the sensation as feeling like urine is backed up and causing pain. Calming himself helps to release some urine, but the sensation of needing to urinate often returns shortly after. He mentions a history of being told his prostate was somewhat enlarged during a physical exam years ago, but he has not been diagnosed with benign prostatic hyperplasia (BPH).  He has notes some intentional lost weight over the past five days, which he attributes to the recent illness. He has been trying to lose weight intentionally  prior to this episode.  In terms of social history, he shares that he is gay and has recently started experimenting with being a receiver during intercourse with his husband over the past six months.  He also reports a history of rapid skin healing and mentions developing zit-like lesions after shoulder surgery on June 24th, which he is concerned have not resolved.         OBJECTIVE:       06/01/2022    9:21 AM  Depression screen PHQ 2/9  Decreased Interest 0  Down, Depressed, Hopeless 0  PHQ - 2 Score 0     BP Readings from Last 3 Encounters:  11/26/23 (!) 150/90  09/20/23 120/86  08/09/23 136/86    BP (!) 150/90   Pulse 84   Wt 175 lb (79.4 kg)   SpO2 98%   BMI 26.61 kg/m    Physical Exam          Physical Exam Constitutional:      Appearance: Normal appearance.  Neurological:     General: No focal deficit present.     Mental Status: He is alert and oriented to person, place, and time.     ASSESSMENT/PLAN:   Assessment & Plan Increased urinary frequency  Benign prostatic hyperplasia with incomplete bladder emptying    Assessment and Plan    Lower urinary tract symptoms due to suspected benign prostatic hyperplasia Symptoms suggest BPH with nocturia, urgency, and incomplete bladder emptying. Urinalysis negative for infection. Weight loss noted. No diabetes. Discussed BPH mechanism and tamsulosin   side effects. - Start tamsulosin  to relax prostate smooth muscle and improve urinary symptoms. - Monitor for dizziness as a side effect of tamsulosin  and discontinue if it occurs. - Repeat PSA to assess for changes in prostate size. - Follow up in one to two weeks to assess symptom improvement with tamsulosin . - Consider starting finasteride to slow prostate growth if symptoms improve with tamsulosin  as it will likely be diagnostic  Acute on chronic HTN - Monitor blood pressure at home and will consider changing medications at visit.    Urinary incresae  frequency - POCT UA shows some blood and protein without infection. - Plan to follow PSA and consider getting more imaging pending results    Stuti Sandin A. Vita MD [facility]

## 2023-11-26 NOTE — Telephone Encounter (Signed)
Pt already has appt

## 2023-11-27 ENCOUNTER — Ambulatory Visit: Payer: Self-pay | Admitting: Family Medicine

## 2023-11-27 ENCOUNTER — Telehealth: Payer: Self-pay

## 2023-11-27 DIAGNOSIS — R972 Elevated prostate specific antigen [PSA]: Secondary | ICD-10-CM

## 2023-11-27 LAB — PSA, TOTAL AND FREE
PSA, Free Pct: 5.1 %
PSA, Free: 1.41 ng/mL
Prostate Specific Ag, Serum: 27.8 ng/mL — ABNORMAL HIGH (ref 0.0–4.0)

## 2023-11-27 NOTE — Telephone Encounter (Signed)
 Copied from CRM [id number]. Topic: Clinical - Lab/Test Results >> Nov 27, 2023  4:00 PM Antwanette L wrote: Reason for CRM: Pt is returning a missed call from [place] about lab results. Patient is requesting a callback  Attempted to call patient back, left voicemail.

## 2023-12-05 ENCOUNTER — Other Ambulatory Visit

## 2023-12-05 DIAGNOSIS — R972 Elevated prostate specific antigen [PSA]: Secondary | ICD-10-CM

## 2023-12-06 ENCOUNTER — Ambulatory Visit: Payer: Self-pay

## 2023-12-06 ENCOUNTER — Ambulatory Visit: Admitting: Family Medicine

## 2023-12-06 VITALS — BP 140/92 | HR 81 | Wt 177.8 lb

## 2023-12-06 DIAGNOSIS — R972 Elevated prostate specific antigen [PSA]: Secondary | ICD-10-CM

## 2023-12-06 DIAGNOSIS — S46012A Strain of muscle(s) and tendon(s) of the rotator cuff of left shoulder, initial encounter: Secondary | ICD-10-CM

## 2023-12-06 LAB — PSA, TOTAL AND FREE
PSA, Free Pct: 2.9 %
PSA, Free: 0.24 ng/mL
Prostate Specific Ag, Serum: 8.3 ng/mL — ABNORMAL HIGH (ref 0.0–4.0)

## 2023-12-06 MED ORDER — MELOXICAM 15 MG PO TABS: 15.0000 mg | ORAL_TABLET | Freq: Every day | ORAL | 0 refills | Status: DC

## 2023-12-06 NOTE — Progress Notes (Signed)
 Name: Jesse Hardin   Date of Visit: 12/06/23   Date of last visit with me: 11/26/2023   CHIEF COMPLAINT:  Chief Complaint  Patient presents with   Acute Visit    Shoulder pain. Lifting something on to deck, shoulder gave out and popped really loud.        HPI:  Discussed the use of AI scribe software for clinical note transcription with the patient, who gave verbal consent to proceed.  History of Present Illness   Jesse Hardin is a 63 year old male who presents with inability to lift his left arm after hearing a pop while lifting a heater.  He was lifting a heater onto a deck when he heard a loud pop in his left shoulder. Since then, he has been unable to lift his left arm. He can move his arm around once it is lifted but cannot initiate the lift himself. He experiences pain when attempting to push up against resistance with his arm in a biceps curl position.  He has a history of rotator cuff repair on the left shoulder over 15 years ago and a shoulder replacement on the right shoulder following a water skiing accident. He notes that he has been hard on his shoulders throughout his life and has arthritis.  He is currently taking ibuprofen for pain. Driving is the biggest challenge due to his arm condition. He works as a Emergency planning/management officer, which allows him to set his arm up and work without much difficulty.  No issues with urinary symptoms and notes improvement with Flomax .         OBJECTIVE:   BP (!) 140/92   Pulse 81   Wt 177 lb 12.8 oz (80.6 kg)   SpO2 96%   BMI 27.03 kg/m    Ortho Exam  Inspection reveals no gross abnormality of the left shoulder.  There is some mild tenderness to palpation over the biceps tendon.  There is significant weakness with external rotation as well as abduction.  Patient is unable to abduct the arm on his own against gravity.  Patient is able to abduct after getting to about 90 degrees via help with other hand and can go from  90-100, patient cannot go from 0-90 on his arm.  No pain with internal rotation and speeds test and Yergason are negative.  IMAGING:  Ultrasound left shoulder: Ultrasound left shoulder shows biceps tendon intact and in the groove with a mild effusion noted.  Pec tendon appears to be intact, subscapularis noted with possible tearing and hypoechoic changes consistent with effusion.  Supraspinatus also appears to be intact at the endplate though muscle body may have some previous tears.  Infraspinatus appears to be born with a mid body tear.  Impression: Intact biceps tendon, partial tearing of the infraspinatus and suspected partial tearing of the subscapularis. ASSESSMENT/PLAN:   Assessment & Plan Traumatic tear of left rotator cuff, unspecified tear extent, initial encounter  Elevated PSA    Assessment and Plan    Left rotator cuff tear Suspected tear involving subscapularis, possibly infraspinatus and supraspinatus, causing significant weakness. Previous repair over 15 years ago. Treatment plan depends on tear severity and arthritis extent. Insurance deductible met, prioritizing surgery this year. - Order MRI of left shoulder to assess rotator cuff tear and arthritis. - Prescribe meloxicam  for 10-14 days for pain and inflammation. - Consider surgical intervention based on MRI findings and arthritis severity.  Left shoulder osteoarthritis Osteoarthritis contributing to joint pain and complicating  rotator cuff tear management. Arthritis severity will guide decision between rotator cuff repair and shoulder replacement. Severe arthritis may necessitate reverse shoulder replacement. - Evaluate osteoarthritis degree on MRI to guide treatment. - Consider reverse shoulder replacement if arthritis is severe and rotator cuff repair is not feasible.  Benign prostatic hyperplasia with lower urinary tract symptoms Urinary symptoms improved with Flomax . Previous elevated PSA likely due to inflammation  or manipulation. Symptoms well-controlled, PSA decreased. - Continue monitoring PSA levels, follow-up test in a few months. - Continue Flomax  as needed for urinary symptoms.         Justus Duerr A. Vita MD [facility]

## 2023-12-06 NOTE — Telephone Encounter (Signed)
 FYI Only or Action Required?: Action required by provider: request for appointment.  Patient was last seen in primary care on 11/26/2023 by Vita Morrow, MD.  Called Nurse Triage reporting Shoulder Pain.  Symptoms began yesterday.  Interventions attempted: Nothing.  Symptoms are: gradually worsening. Pt. Was lifting something last night and heard a pop in left shoulder. Cannot move shoulder. Can move lower arm.  Triage Disposition: See HCP Within 4 Hours (Or PCP Triage)  Patient/caregiver understands and will follow disposition?: Yes   Copied from CRM #[id number]. Topic: Clinical - Red Word Triage >> Dec 06, 2023  7:41 AM Jesse Hardin wrote: Red Word that prompted transfer to Nurse Triage: Left shoulder popped, cannot move at all. Severe pain. Answer Assessment - Initial Assessment Questions 1. ONSET: When did the pain start?     Last night 2. LOCATION: Where is the pain located?     Left  3. PAIN: How bad is the pain? (Scale 1-10; or mild, moderate, severe)     severe 4. WORK OR EXERCISE: Has there been any recent work or exercise that involved this part of the body?     yes 5. CAUSE: What do you think is causing the shoulder pain?     injury 6. OTHER SYMPTOMS: Do you have any other symptoms? (e.g., neck pain, swelling, rash, fever, numbness, weakness)     no 7. PREGNANCY: Is there any chance you are pregnant? When was your last menstrual period?     N/a  Protocols used: Shoulder Pain-A-AH  Reason for Disposition  [1] Shoulder pains with exertion (e.g., walking) AND [2] pain goes away on resting AND [3] not present now  Answer Assessment - Initial Assessment Questions 1. ONSET: When did the pain start?     Last night 2. LOCATION: Where is the pain located?     Left  3. PAIN: How bad is the pain? (Scale 1-10; or mild, moderate, severe)     severe 4. WORK OR EXERCISE: Has there been any recent work or exercise that involved this part of the body?      yes 5. CAUSE: What do you think is causing the shoulder pain?     injury 6. OTHER SYMPTOMS: Do you have any other symptoms? (e.g., neck pain, swelling, rash, fever, numbness, weakness)     no 7. PREGNANCY: Is there any chance you are pregnant? When was your last menstrual period?     N/a  Protocols used: Shoulder Pain-A-AH

## 2023-12-07 ENCOUNTER — Ambulatory Visit

## 2023-12-07 ENCOUNTER — Encounter: Payer: Self-pay | Admitting: Medical

## 2023-12-07 ENCOUNTER — Ambulatory Visit: Payer: Self-pay | Admitting: Family Medicine

## 2023-12-07 ENCOUNTER — Ambulatory Visit
Admission: RE | Admit: 2023-12-07 | Discharge: 2023-12-07 | Disposition: A | Discharge status: Home / Self Care | Source: Ambulatory Visit | Attending: Family Medicine | Admitting: Family Medicine

## 2023-12-07 DIAGNOSIS — S46012A Strain of muscle(s) and tendon(s) of the rotator cuff of left shoulder, initial encounter: Secondary | ICD-10-CM

## 2023-12-07 MED ORDER — GADOPICLENOL 0.5 MMOL/ML IV SOLN
8.0000 mL | Freq: Once | INTRAVENOUS | Status: AC | PRN
  Administered 2023-12-07: 8 mL via INTRAVENOUS

## 2023-12-10 ENCOUNTER — Telehealth: Payer: Self-pay

## 2023-12-10 NOTE — Telephone Encounter (Signed)
 Auth Submission: APPROVED Site of care: Site of care: [facility] Payer: [employer] commercial Medication & CPT/J Code(s) submitted: Prolia  (Denosumab ) [id number] Diagnosis Code:  Route of submission (phone, fax, portal): portal Phone # Fax # Auth type: Buy/Bill PB Units/visits requested: 60mg  x 2 doses Reference number: [id number] Approval from: 12/07/23 to 12/06/24

## 2023-12-16 ENCOUNTER — Other Ambulatory Visit: Payer: Self-pay | Admitting: Medical

## 2024-01-01 ENCOUNTER — Other Ambulatory Visit: Payer: Self-pay | Admitting: Medical

## 2024-01-01 MED ORDER — AMOXICILLIN-POT CLAVULANATE 875-125 MG PO TABS
1.0000 | ORAL_TABLET | Freq: Two times a day (BID) | ORAL | 0 refills | Status: DC
Start: 2024-01-01 — End: 2024-02-08

## 2024-01-08 ENCOUNTER — Ambulatory Visit: Payer: 59 | Admitting: Medical

## 2024-01-08 ENCOUNTER — Encounter: Payer: Self-pay | Admitting: Medical

## 2024-01-08 VITALS — BP 120/70 | HR 63 | Ht 68.5 in | Wt 175.6 lb

## 2024-01-08 DIAGNOSIS — Z0000 Encounter for general adult medical examination without abnormal findings: Secondary | ICD-10-CM

## 2024-01-08 DIAGNOSIS — R7989 Other specified abnormal findings of blood chemistry: Secondary | ICD-10-CM

## 2024-01-08 DIAGNOSIS — M810 Age-related osteoporosis without current pathological fracture: Secondary | ICD-10-CM

## 2024-01-08 DIAGNOSIS — E785 Hyperlipidemia, unspecified: Secondary | ICD-10-CM

## 2024-01-08 DIAGNOSIS — Z23 Encounter for immunization: Secondary | ICD-10-CM | POA: Diagnosis not present

## 2024-01-08 DIAGNOSIS — R41840 Attention and concentration deficit: Secondary | ICD-10-CM

## 2024-01-08 DIAGNOSIS — F411 Generalized anxiety disorder: Secondary | ICD-10-CM

## 2024-01-08 DIAGNOSIS — Z125 Encounter for screening for malignant neoplasm of prostate: Secondary | ICD-10-CM | POA: Diagnosis not present

## 2024-01-08 DIAGNOSIS — R972 Elevated prostate specific antigen [PSA]: Secondary | ICD-10-CM

## 2024-01-08 DIAGNOSIS — Z79899 Other long term (current) drug therapy: Secondary | ICD-10-CM

## 2024-01-08 DIAGNOSIS — F319 Bipolar disorder, unspecified: Secondary | ICD-10-CM

## 2024-01-08 DIAGNOSIS — Z7185 Encounter for immunization safety counseling: Secondary | ICD-10-CM

## 2024-01-08 LAB — LIPID PANEL
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
## 2024-01-09 ENCOUNTER — Ambulatory Visit: Payer: Self-pay | Admitting: Medical

## 2024-01-09 ENCOUNTER — Other Ambulatory Visit: Payer: Self-pay | Admitting: Medical

## 2024-01-09 DIAGNOSIS — R350 Frequency of micturition: Secondary | ICD-10-CM

## 2024-01-09 DIAGNOSIS — N401 Enlarged prostate with lower urinary tract symptoms: Secondary | ICD-10-CM

## 2024-01-09 LAB — COMPREHENSIVE METABOLIC PANEL WITH GFR
ALT: 29 IU/L (ref 0–44)
AST: 36 IU/L (ref 0–40)
Albumin: 4.5 g/dL (ref 3.9–4.9)
Alkaline Phosphatase: 76 IU/L (ref 47–123)
BUN/Creatinine Ratio: 13 (ref 10–24)
BUN: 16 mg/dL (ref 8–27)
Bilirubin Total: 0.4 mg/dL (ref 0.0–1.2)
CO2: 23 mmol/L (ref 20–29)
Calcium: 9.6 mg/dL (ref 8.6–10.2)
Chloride: 104 mmol/L (ref 96–106)
Creatinine, Ser: 1.19 mg/dL (ref 0.76–1.27)
Globulin, Total: 2.4 g/dL (ref 1.5–4.5)
Glucose: 88 mg/dL (ref 70–99)
Potassium: 4.1 mmol/L (ref 3.5–5.2)
Sodium: 142 mmol/L (ref 134–144)
Total Protein: 6.9 g/dL (ref 6.0–8.5)
eGFR: 69 mL/min/1.73 (ref >59)

## 2024-01-09 LAB — 739013 U9-UNBUND+SVT
6-Acetylmorphine, Urine: NEGATIVE ng/mL
Amphetamine Screen, Urine: NEGATIVE ng/mL
BENZODIAZ UR QL: NEGATIVE ng/mL
Barbiturate screen, urine: NEGATIVE ng/mL
Cannabinoid: NEGATIVE ng/mL
Cocaine (Metab.), Urine: NEGATIVE ng/mL
Creatinine, Urine: 126.4 mg/dL (ref 20.0–300.0)
EDDP, Urine: NEGATIVE ng/mL
Nitrite Urine, Quantitative: NEGATIVE ug/mL
OPIATE SCREEN URINE: NEGATIVE ng/mL
Oxycodone/Oxymorphone, Urine: NEGATIVE ng/mL
pH, Urine: 5.1 (ref 4.5–8.9)

## 2024-01-09 LAB — PSA, TOTAL AND FREE
PSA, Free Pct: 7.4 %
PSA, Free: 0.28 ng/mL
Prostate Specific Ag, Serum: 3.8 ng/mL (ref 0.0–4.0)

## 2024-01-09 LAB — CBC
Hematocrit: 43 % (ref 37.5–51.0)
Hemoglobin: 13.7 g/dL (ref 13.0–17.7)
MCH: 27.8 pg (ref 26.6–33.0)
MCHC: 31.9 g/dL (ref 31.5–35.7)
MCV: 87 fL (ref 79–97)
Platelets: 313 x10E3/uL (ref 150–450)
RBC: 4.92 x10E6/uL (ref 4.14–5.80)
RDW: 14.1 % (ref 11.6–15.4)
WBC: 8 x10E3/uL (ref 3.4–10.8)

## 2024-01-09 LAB — LIPID PANEL
Cholesterol, Total: 162 mg/dL (ref 100–199)
HDL: 78 mg/dL (ref >39)
LDL CALC COMMENT:: 2.1 ratio (ref 0.0–5.0)
LDL Chol Calc (NIH): 71 mg/dL (ref 0–99)
Triglycerides: 65 mg/dL (ref 0–149)
VLDL Cholesterol Cal: 13 mg/dL (ref 5–40)

## 2024-01-09 LAB — VITAMIN D 25 HYDROXY (VIT D DEFICIENCY, FRACTURES): Vit D, 25-Hydroxy: 41.5 ng/mL (ref 30.0–100.0)

## 2024-01-09 MED ORDER — TESTOSTERONE 20.25 MG/ACT (1.62%) TD GEL: TRANSDERMAL | 5 refills | Status: AC

## 2024-01-09 MED ORDER — TAMSULOSIN HCL 0.4 MG PO CAPS: 0.4000 mg | ORAL_CAPSULE | Freq: Every day | ORAL | 1 refills | Status: DC

## 2024-01-09 MED ORDER — BUPROPION HCL ER (XL) 300 MG PO TB24: 300.0000 mg | ORAL_TABLET | Freq: Every day | ORAL | 1 refills | Status: AC

## 2024-01-09 MED ORDER — VITAMIN D3 50 MCG (2000 UT) PO CAPS: 2000.0000 [IU] | ORAL_CAPSULE | Freq: Every day | ORAL | 1 refills | Status: AC

## 2024-01-09 NOTE — Progress Notes (Signed)
 Results through MyChart

## 2024-01-10 NOTE — Progress Notes (Signed)
 Results through MyChart

## 2024-01-28 ENCOUNTER — Ambulatory Visit

## 2024-01-29 ENCOUNTER — Telehealth: Payer: Self-pay | Admitting: *Deleted

## 2024-01-29 NOTE — Telephone Encounter (Signed)
 Patient requesting to switch from [facility] to Dr. Vita for PCP.

## 2024-01-30 ENCOUNTER — Other Ambulatory Visit: Payer: Self-pay | Admitting: Medical Genetics

## 2024-01-30 ENCOUNTER — Encounter: Payer: Self-pay | Admitting: *Deleted

## 2024-01-30 NOTE — Telephone Encounter (Signed)
 Change made and future appt r/s'd to Dr.Jha.

## 2024-02-06 ENCOUNTER — Telehealth: Payer: Self-pay | Admitting: Pulmonary Disease

## 2024-02-06 NOTE — Telephone Encounter (Signed)
 Fax received from Dr. Eva Herring with [facility] to perform a left reverse totahoulder arthroplasty under choice anesthesia on patient.  Patient needs surgery clearance. Surgery is 02/26/24. Patient was seen on 09/18/23. Office protocol is a risk assessment can be sent to surgeon if patient has been seen in 60 days or less.   Sending to for risk assessment or recommendations if patient needs to be seen in office prior to surgical procedure.   Pt is scheduled for OV with Dr. Kassie for 02/08/24. I have added risk assessment needed to his appt notes and will route back to the clearance pool until visit is complete.

## 2024-02-08 ENCOUNTER — Ambulatory Visit (HOSPITAL_BASED_OUTPATIENT_CLINIC_OR_DEPARTMENT_OTHER): Admitting: Pulmonary Disease

## 2024-02-08 ENCOUNTER — Encounter (HOSPITAL_BASED_OUTPATIENT_CLINIC_OR_DEPARTMENT_OTHER): Payer: Self-pay | Admitting: Pulmonary Disease

## 2024-02-08 DIAGNOSIS — J4550 Severe persistent asthma, uncomplicated: Secondary | ICD-10-CM | POA: Diagnosis not present

## 2024-02-08 DIAGNOSIS — R0981 Nasal congestion: Secondary | ICD-10-CM | POA: Diagnosis not present

## 2024-02-08 DIAGNOSIS — Z01811 Encounter for preprocedural respiratory examination: Secondary | ICD-10-CM | POA: Diagnosis not present

## 2024-02-08 MED ORDER — FLUTICASONE-SALMETEROL 500-50 MCG/ACT IN AEPB: 1.0000 | INHALATION_SPRAY | Freq: Two times a day (BID) | RESPIRATORY_TRACT | 5 refills | Status: AC

## 2024-02-08 NOTE — Patient Instructions (Signed)
 Peri-operative Assessment of Pulmonary Risk for Non-Thoracic Surgery: Left shoulder surgery  For Mr. Sterkel, risk of perioperative pulmonary complications is increased by:  ELPIDIO ]Age greater than 65 years  [ ]  COPD  [ ] Serum albumin <3.5  [ ] Smoking  [ ] Obstructive sleep apnea  [ ] NYHA Class II Pulmonary Hypertension  ARISCAT: Low risk 1.6% risk of in-hospital post-op pulmonary complications (composite including respiratory failure, respiratory infection, pleural effusion, atelectasis, pneumothorax, bronchospasm, aspiration pneumonitis)  Respiratory complications generally occur in 1% of ASA Class I patients, 5% of ASA Class II and 10% of ASA Class III-IV patients These complications rarely result in mortality and include postoperative pneumonia, atelectasis, pulmonary embolism, ARDS and increased time requiring postoperative mechanical ventilation.  Overall, I recommend proceeding with the surgery if the risk for respiratory complications are outweighed by the potential benefits. This will need to be discussed between the patient and surgeon.  To reduce risks of respiratory complications, I recommend: --Pre- and post-operative incentive spirometry performed frequently while awake --Avoiding/minimizing use of paralytics if possible during anesthesia.  1) RISK FOR PROLONGED MECHANICAL VENTILAION - > 48h   1A) Arozullah - Prolonged mech ventilation risk Arozullah Postperative Pulmonary Risk Score - for mech ventilation dependence >48h Usaa, Ann Surg 2000, major non-cardiac surgery) Comment Score  Type of surgery - abd ao aneurysm (27), thoracic (21), neurosurgery / upper abdominal / vascular (21), neck (11)    Emergency Surgery - (11)     ALbumin < 3 or poor nutritional state - (9)     BUN > 30 -  (8)    Partial or completely dependent functional status - (7)     COPD -  (6)    Age - 60 to 69 (4), > 70  (6) 63 4  TOTAL     Risk Stratifcation scores  - < 10 (0.5%), 11-19  (1.8%), 20-27 (4.2%), 28-40 (10.1%), >40 (26.6%)  <10 0.5%      I have discussed the risk factors and recommendations above with the patient.

## 2024-02-08 NOTE — Progress Notes (Signed)
 Subjective:   PATIENT ID: Jesse Hardin GENDER: male DOB: July 18, 1960, MRN: [id number]   HPI  Chief Complaint  Patient presents with   Asthma    Needs surgery clearance    Reason for Visit: Follow-up  [name] is a 63 year old male with GERD, hiatal hernia, s/p esophageal dilation x 2 in 2022 who presents for follow-up asthma  Initial consult He reports allergies for month that will trigger his cough however this year he has had a cough for 6 months with no trigger that has persisted. He will have nonproductive coughing spells that are severe and can last 10-15 min. Reports wheezing 1-2 x week during the daytime. Has coughing that will awaken him. Treated with Tussionex with improvement in symptoms and has allowed him to sleep. Allegra and zyrtec was not effective. Denies nasal congestion. On protonix  40 mg twice daily  12/27/21 He reports compliance with Breo 200 daily. Not effective. Maybe some benefit in cough in the mornings but worsening symptoms during the afternoon. Not able to get the breaths that he needs. He is no longer wheezing. He wakes up nightly with coughing spells that will lead to gagging. Mucinex is ineffective.  02/27/22 Since our last visit he was transitioned from Breo to Advair 500. He was recently seen on 11/71/23 by NP Moishe in [facility] for sinus pressure and nasal congestion and treated with acute bacterial sinusitis with augmentin  and tessalon  perles. Prior to illness he reports near resolution of cough. Medications has helped as well as isolation at his workplace with separate office with door. No shortness of breath or cough.  11/17/22 Since our last visit he is no longer taking Advair. Using it once every 2 weeks he will need it for 2-3 days. He now works in a separate office with an air filter and felt this has made the biggest impact. Fall and winter is when his worst times for him.   06/13/23 Since winter his allergies with nasal  drainage are starting to worsen and is affecting his asthma and starting to cough and gag at times. Occasional wheezing. He has started taking Advair 500 once a day. Will start to act up by the evening. Symptoms awaken him at night. Will awaken with daytime headaches.  02/08/24 Since our last visit he had right shoulder replaced in June 2025 with no complications. He reports no issues with respiratory status. Overall asthma is well controlled and currently on Advair. No wheezing, cough, shortness of breath. No longer using cough drops compared to last year. No exacerbations since last visit.   Asthma Control Test ACT Total Score  02/08/2024  9:55 AM 22  06/13/2023  4:12 PM 15   Social History: Never smoker   Past Medical History:  Diagnosis Date   Allergy     Anxiety    Bipolar disorder (HCC)    quick cycler, hx/o manic episodes if off medication   Colon polyps 06/26/2011   Ascending colon   Depression    Diverticulitis of colon 06/26/2011   per colonoscopy   GERD (gastroesophageal reflux disease)    Grade I internal hemorrhoids 06/26/2011   colonscopy.    HLD (hyperlipidemia)    Insomnia    Migraine    Osteoporosis    Pneumonia    Sexual dysfunction      Family History  Problem Relation Age of Onset   Depression Mother    Heart disease Father 91       CABG  Depression Sister    Depression Sister    Cancer Sister        breast lesions   Cancer Paternal Grandmother        type unknown, ? lung, heavy smoker   Stroke Neg Hx    Diabetes Neg Hx    Colon cancer Neg Hx    Rectal cancer Neg Hx    Stomach cancer Neg Hx      Social History   Occupational History   Occupation: Chiropractor  Tobacco Use   Smoking status: Never    Passive exposure: Never   Smokeless tobacco: Never  Vaping Use   Vaping status: Never Used  Substance and Sexual Activity   Alcohol use: Not Currently   Drug use: No   Sexual activity: Not on file    No Known Allergies    Outpatient Medications Prior to Visit  Medication Sig Dispense Refill   b complex vitamins capsule Take 1 capsule by mouth daily. 30 capsule 1   buPROPion  (WELLBUTRIN  XL) 300 MG 24 hr tablet Take 1 tablet (300 mg total) by mouth daily. 90 tablet 1   Cetirizine HCl (ZYRTEC PO) Take by mouth. Alternating every other day with allegra     Cholecalciferol (VITAMIN D3) 50 MCG (2000 UT) capsule Take 1 capsule (2,000 Units total) by mouth daily. 90 capsule 1   FEXOFENADINE HCL PO Take by mouth. Every other day     ipratropium (ATROVENT ) 0.06 % nasal spray Place into both nostrils.     methylphenidate  (RITALIN  LA) 30 MG 24 hr capsule Take 1 capsule (30 mg total) by mouth every morning. 90 capsule 0   pantoprazole  (PROTONIX ) 40 MG tablet TAKE 1 TABLET BY MOUTH EVERY DAY 90 tablet 1   rosuvastatin  (CRESTOR ) 10 MG tablet Take 1 tablet (10 mg total) by mouth daily. 90 tablet 1   Testosterone  20.25 MG/ACT (1.62%) GEL APPLY 3 SQUIRTS ONTO THE SKIN DAILY 75 g 5   fluticasone -salmeterol (ADVAIR) 500-50 MCG/ACT AEPB Inhale 1 puff into the lungs in the morning and at bedtime. 60 each 5   tamsulosin  (FLOMAX ) 0.4 MG CAPS capsule Take 1 capsule (0.4 mg total) by mouth daily. 90 capsule 1   amoxicillin -clavulanate (AUGMENTIN ) 875-125 MG tablet Take 1 tablet by mouth 2 (two) times daily. 14 tablet 0   No facility-administered medications prior to visit.    Review of Systems  Constitutional:  Negative for chills, diaphoresis, fever, malaise/fatigue and weight loss.  HENT:  Negative for congestion.   Respiratory:  Negative for cough, hemoptysis, sputum production, shortness of breath and wheezing.   Cardiovascular:  Negative for chest pain, palpitations and leg swelling.     Objective:   Vitals:   02/08/24 0956  BP: 130/82  Pulse: 88  Temp: 97.8 F (36.6 C)  TempSrc: Oral  SpO2: 98%  Weight: 176 lb 9.6 oz (80.1 kg)  Height: 5' 8 (1.727 m)   SpO2: 98 %  Physical Exam: General: Well-appearing, no  acute distress HENT: [place], AT Eyes: EOMI, no scleral icterus Respiratory: Clear to auscultation bilaterally.  No crackles, wheezing or rales Cardiovascular: RRR, -M/R/G, no JVD Extremities:-Edema,-tenderness Neuro: AAO x4, CNII-XII grossly intact Psych: Normal mood, normal affect   Data Reviewed:  Imaging: CXR 08/15/21 - Normal. No infiltrate, effusion or edema  PFT: 09/23/21 FVC 3.6 (81%) FEV1 2.7 (80%) Ratio 76   Interpretation: Normal spirometry  11/18/21 Methacholine  challenge positive for hyperreactive airways with FEV1 reduction by 26% at 16 mg/ml.  Labs:  CBC    Component Value Date/Time   WBC 8.0 01/08/2024 1546   WBC 6.2 01/26/2017 0845   RBC 4.92 01/08/2024 1546   RBC 4.94 01/26/2017 0845   HGB 13.7 01/08/2024 1546   HCT 43.0 01/08/2024 1546   PLT 313 01/08/2024 1546   MCV 87 01/08/2024 1546   MCH 27.8 01/08/2024 1546   MCH 29.6 01/26/2017 0845   MCHC 31.9 01/08/2024 1546   MCHC 34.2 01/26/2017 0845   RDW 14.1 01/08/2024 1546   LYMPHSABS 2.4 09/20/2023 1616   EOSABS 0.2 09/20/2023 1616   BASOSABS 0.0 09/20/2023 1616   Absolute eos 09/03/18 - 100     Assessment & Plan:   Discussion: 63 year old male with GERD, hiatal hernia s/p esophageal dilation x 2 in 2002 who presents for pre-op evaluation. Fall/winter/spring are his worst symptoms from Oct-April. Well controlled asthma. Discussed clinical course and management of asthma including bronchodilator regimen, preventive care including vaccinations and action plan for exacerbation.  Severe persistent asthma - well controlled --CONTINUE Advair 500-50 mcg ONE puff in the morning and evening. Rinse mouth out after use. --Ok to decrease to once a day during non-allergy  seasons --ORDER pulmonary function tests  Nasal congestion - well controlled --CONTINUE atrovent  nasal spray 2 sprays per nostril up to four times as needed  Hx esophageal dilation, hiatal hernia --CONTINUE protonix  40 mg twice  daily  Peri-operative Assessment of Pulmonary Risk for Non-Thoracic Surgery: Left shoulder surgery  For [name], risk of perioperative pulmonary complications is increased by:  ELPIDIO ]Age greater than 65 years  [ ]  COPD  [ ] Serum albumin <3.5  [ ] Smoking  [ ] Obstructive sleep apnea  [ ] NYHA Class II Pulmonary Hypertension  ARISCAT: Low risk 1.6% risk of in-hospital post-op pulmonary complications (composite including respiratory failure, respiratory infection, pleural effusion, atelectasis, pneumothorax, bronchospasm, aspiration pneumonitis)  Respiratory complications generally occur in 1% of ASA Class I patients, 5% of ASA Class II and 10% of ASA Class III-IV patients These complications rarely result in mortality and include postoperative pneumonia, atelectasis, pulmonary embolism, ARDS and increased time requiring postoperative mechanical ventilation.  Overall, I recommend proceeding with the surgery if the risk for respiratory complications are outweighed by the potential benefits. This will need to be discussed between the patient and surgeon.  To reduce risks of respiratory complications, I recommend: --Pre- and post-operative incentive spirometry performed frequently while awake --Avoiding/minimizing use of paralytics if possible during anesthesia.  1) RISK FOR PROLONGED MECHANICAL VENTILAION - > 48h   1A) Arozullah - Prolonged mech ventilation risk Arozullah Postperative Pulmonary Risk Score - for mech ventilation dependence >48h Usaa, Ann Surg 2000, major non-cardiac surgery) Comment Score  Type of surgery - abd ao aneurysm (27), thoracic (21), neurosurgery / upper abdominal / vascular (21), neck (11)    Emergency Surgery - (11)     ALbumin < 3 or poor nutritional state - (9)     BUN > 30 -  (8)    Partial or completely dependent functional status - (7)     COPD -  (6)    Age - 60 to 69 (4), > 70  (6) 63 4  TOTAL     Risk Stratifcation scores  - < 10  (0.5%), 11-19 (1.8%), 20-27 (4.2%), 28-40 (10.1%), >40 (26.6%)  <10 0.5%      I have discussed the risk factors and recommendations above with the patient.  Health Maintenance Immunization History  Administered Date(s) Administered   Influenza Inj  Mdck Quad Pf 01/21/2017   Influenza Split 02/15/2009   Influenza, Seasonal, Injecte, Preservative Fre 12/01/2013, 02/16/2015, 01/04/2023, 01/08/2024   Influenza,inj,Quad PF,6+ Mos 02/19/2018, 01/04/2021, 12/08/2021   Influenza-Unspecified 12/08/2017, 01/08/2019, 02/07/2020   Janssen (J&J) SARS-COV-2 Vaccination 06/13/2019   PFIZER(Purple Top)SARS-COV-2 Vaccination 01/26/2020   Pfizer Covid-19 Vaccine Bivalent Booster 52yrs & up 01/04/2021   Pfizer(Comirnaty)Fall Seasonal Vaccine 12 years and older 01/08/2024   Pneumococcal Conjugate-13 12/08/2021   Tdap 04/07/2013   Unspecified SARS-COV-2 Vaccination 01/07/2022   CT Lung Screen - never smoker  No orders of the defined types were placed in this encounter.  Meds ordered this encounter  Medications   fluticasone -salmeterol (ADVAIR) 500-50 MCG/ACT AEPB    Sig: Inhale 1 puff into the lungs in the morning and at bedtime.    Dispense:  60 each    Refill:  5    Return in about 1 year (around 02/07/2025) for OK to schedule PFT anytime. F/u in 1 year.   Babs Dabbs Slater Staff, MD [facility] 02/08/2024 10:24 AM

## 2024-02-12 ENCOUNTER — Encounter: Payer: Self-pay | Admitting: Family Medicine

## 2024-02-12 MED ORDER — METHYLPHENIDATE HCL ER (LA) 30 MG PO CP24: 30.0000 mg | ORAL_CAPSULE | ORAL | 0 refills | Status: DC

## 2024-02-19 NOTE — Telephone Encounter (Signed)
 Copy of 02/08/24 ov note with Dr. Kassie was faxed to [facility].

## 2024-02-21 ENCOUNTER — Telehealth: Payer: Self-pay | Admitting: Family Medicine

## 2024-02-21 NOTE — Telephone Encounter (Signed)
 Copied from CRM #[id number]. Topic: General - Other >> Feb 21, 2024 10:22 AM Hadassah PARAS wrote: Reason for CRM: Vina from [facility] is calling to confirm office received fax for medical clearance. Surgery is scheduled for Tuesday 11/26 annd are needing this paperwork sent back as soon as possible.  Forms sent back to Dr Vita

## 2024-02-21 NOTE — Telephone Encounter (Signed)
 [facility] surgical clearance form sent back in folder

## 2024-03-18 ENCOUNTER — Other Ambulatory Visit: Payer: Self-pay

## 2024-03-24 ENCOUNTER — Ambulatory Visit: Admitting: Family Medicine

## 2024-03-24 ENCOUNTER — Other Ambulatory Visit (HOSPITAL_BASED_OUTPATIENT_CLINIC_OR_DEPARTMENT_OTHER): Payer: Self-pay

## 2024-03-24 ENCOUNTER — Encounter: Payer: Self-pay | Admitting: Family Medicine

## 2024-03-24 VITALS — BP 126/86 | HR 104 | Wt 177.2 lb

## 2024-03-24 DIAGNOSIS — R0602 Shortness of breath: Secondary | ICD-10-CM

## 2024-03-24 DIAGNOSIS — J3089 Other allergic rhinitis: Secondary | ICD-10-CM | POA: Diagnosis not present

## 2024-03-24 DIAGNOSIS — R040 Epistaxis: Secondary | ICD-10-CM | POA: Diagnosis not present

## 2024-03-24 MED ORDER — FLUTICASONE PROPIONATE 50 MCG/ACT NA SUSP: 2.0000 | Freq: Every day | NASAL | 6 refills | Status: AC

## 2024-03-24 NOTE — Progress Notes (Signed)
 "  Name: Jesse Hardin   Date of Visit: 03/24/2024   Date of last visit with me: 02/21/2024   CHIEF COMPLAINT:  Chief Complaint  Patient presents with   Acute Visit    Post nasal drip, bloody snot, ear pain.        HPI:  Discussed the use of AI scribe software for clinical note transcription with the patient, who gave verbal consent to proceed.  History of Present Illness   Jesse Hardin is a 63 year old male who presents with symptoms of postnasal drip and ear discomfort.  He experiences postnasal drip a couple of times a day, primarily from afternoon to night, describing it as phlegm dripping down his throat, causing gagging. He frequently blows his nose, often resulting in epistaxis. He has not been using any nasal sprays recently, although he was previously prescribed ipratropium (Atrovent ) by his pulmonologist, which he forgot to use. He is currently taking Zyrtec for his symptoms.  He reports ear discomfort, noting that his ears hurt and have crusty material in them. His hearing improved after one ear 'popped' recently. These symptoms have been present for a couple of weeks, with a gradual worsening over time. He recalls a past episode of similar symptoms and mentions a history of seeing an ENT two years ago for pimples or oil blisters in the ear canal, which resolved by the time of the appointment. He also recalls having large lumps under his armpits two years ago, which were undiagnosed but eventually resolved.         OBJECTIVE:       01/08/2024    2:40 PM  Depression screen PHQ 2/9  Decreased Interest 2  Down, Depressed, Hopeless 1  PHQ - 2 Score 3  Altered sleeping 3  Tired, decreased energy 3  Change in appetite 0  Feeling bad or failure about yourself  0  Trouble concentrating 3  Moving slowly or fidgety/restless 0  Suicidal thoughts 0  PHQ-9 Score 12   Difficult doing work/chores Not difficult at all     Data saved with a previous flowsheet  row definition     BP Readings from Last 3 Encounters:  03/24/24 126/86  02/08/24 130/82  01/08/24 120/70    BP 126/86   Pulse (!) 104   Wt 177 lb 3.2 oz (80.4 kg)   SpO2 95%   BMI 26.94 kg/m    Physical Exam          Physical Exam Constitutional:      Appearance: Normal appearance.  HENT:     Nose: Rhinorrhea present. No congestion.  Neurological:     General: No focal deficit present.     Mental Status: He is alert and oriented to person, place, and time. Mental status is at baseline.     ASSESSMENT/PLAN:   Assessment & Plan Non-seasonal allergic rhinitis, unspecified trigger  Bloody nose    Assessment and Plan    Allergic rhinitis Symptoms of post-nasal drip, ear pain, and epistaxis due to increased mucus production and congestion. Differential includes allergic flare-up. - Use ipratropium nasal spray as prescribed by pulmonologist. - Use Flonase  nasal spray nightly. - Ensure humidifier is running in the room. - Continue taking Zyrtec. - Consider short course of antibiotics if symptoms do not improve by Wednesday.  Epistaxis Secondary to nasal congestion and increased mucus production from allergic rhinitis. - Use ipratropium nasal spray to reduce mucus production. - Use Flonase  nasal spray to reduce inflammation.  Chronic  otitis externa Intermittent ear pain and crusting possibly due to fluid accumulation and pressure changes from congestion. - Apply rubbing alcohol to the ear canal after showering to reduce moisture and prevent bacterial growth.         Jesse Debono A. Vita MD [facility] "

## 2024-03-28 ENCOUNTER — Ambulatory Visit: Admitting: Family Medicine

## 2024-03-31 ENCOUNTER — Ambulatory Visit (HOSPITAL_BASED_OUTPATIENT_CLINIC_OR_DEPARTMENT_OTHER)

## 2024-03-31 DIAGNOSIS — R0602 Shortness of breath: Secondary | ICD-10-CM

## 2024-03-31 LAB — PULMONARY FUNCTION TEST
DL/VA % pred: 98 %
DL/VA: 4.15 ml/min/mmHg/L
DLCO unc % pred: 82 %
DLCO unc: 21.61 ml/min/mmHg
FEF 25-75 Post: 2.66 L/s
FEF 25-75 Pre: 2.3 L/s
FEF2575-%Change-Post: 15 %
FEF2575-%Pred-Post: 97 %
FEF2575-%Pred-Pre: 84 %
FEV1-%Change-Post: 3 %
FEV1-%Pred-Post: 91 %
FEV1-%Pred-Pre: 88 %
FEV1-Post: 3.08 L
FEV1-Pre: 2.98 L
FEV1FVC-%Change-Post: 2 %
FEV1FVC-%Pred-Pre: 99 %
FEV6-%Change-Post: 0 %
FEV6-%Pred-Post: 94 %
FEV6-%Pred-Pre: 93 %
FEV6-Post: 4 L
FEV6-Pre: 3.97 L
FEV6FVC-%Pred-Post: 105 %
FEV6FVC-%Pred-Pre: 105 %
FVC-%Change-Post: 0 %
FVC-%Pred-Post: 89 %
FVC-%Pred-Pre: 89 %
FVC-Post: 4 L
FVC-Pre: 3.97 L
Post FEV1/FVC ratio: 77 %
Post FEV6/FVC ratio: 100 %
Pre FEV1/FVC ratio: 75 %
Pre FEV6/FVC Ratio: 100 %
RV % pred: 115 %
RV: 2.53 L
TLC % pred: 94 %
TLC: 6.35 L

## 2024-03-31 NOTE — Patient Instructions (Signed)
 Full PFT performed today.

## 2024-03-31 NOTE — Progress Notes (Signed)
 Full PFT performed today.

## 2024-04-10 ENCOUNTER — Ambulatory Visit (HOSPITAL_BASED_OUTPATIENT_CLINIC_OR_DEPARTMENT_OTHER): Payer: Self-pay | Admitting: Pulmonary Disease

## 2024-04-18 ENCOUNTER — Telehealth: Admitting: Physician Assistant

## 2024-04-18 ENCOUNTER — Encounter

## 2024-04-18 DIAGNOSIS — J069 Acute upper respiratory infection, unspecified: Secondary | ICD-10-CM | POA: Diagnosis not present

## 2024-04-18 MED ORDER — BENZONATATE 200 MG PO CAPS: 200.0000 mg | ORAL_CAPSULE | Freq: Three times a day (TID) | ORAL | 0 refills | Status: AC | PRN

## 2024-04-18 MED ORDER — PROMETHAZINE-DM 6.25-15 MG/5ML PO SYRP: 5.0000 mL | ORAL_SOLUTION | Freq: Every evening | ORAL | 0 refills | Status: AC

## 2024-04-18 NOTE — Patient Instructions (Signed)
 " Jesse Hardin, thank you for joining Elasia Furnish, PA-C for today's virtual visit.  While this provider is not your primary care provider (PCP), if your PCP is located in our provider database this encounter information will be shared with them immediately following your visit.   A [facility] MyChart account gives you access to today's visit and all your visits, tests, and labs performed at [facility]  click here if you don't have a [facility] MyChart account or go to [email or web address]  Consent: (Patient) Tao Satz provided verbal consent for this virtual visit at the beginning of the encounter.  Current Medications:  Current Outpatient Medications:    benzonatate  (TESSALON ) 200 MG capsule, Take 1 capsule (200 mg total) by mouth 3 (three) times daily as needed for up to 7 days for cough., Disp: 21 capsule, Rfl: 0   promethazine -dextromethorphan (PROMETHAZINE -DM) 6.25-15 MG/5ML syrup, Take 5 mLs by mouth at bedtime for 7 days., Disp: 40 mL, Rfl: 0   b complex vitamins capsule, Take 1 capsule by mouth daily., Disp: 30 capsule, Rfl: 1   buPROPion  (WELLBUTRIN  XL) 300 MG 24 hr tablet, Take 1 tablet (300 mg total) by mouth daily., Disp: 90 tablet, Rfl: 1   Cetirizine HCl (ZYRTEC PO), Take by mouth. Alternating every other day with allegra, Disp: , Rfl:    Cholecalciferol (VITAMIN D3) 50 MCG (2000 UT) capsule, Take 1 capsule (2,000 Units total) by mouth daily., Disp: 90 capsule, Rfl: 1   FEXOFENADINE HCL PO, Take by mouth. Every other day, Disp: , Rfl:    fluticasone  (FLONASE ) 50 MCG/ACT nasal spray, Place 2 sprays into both nostrils daily., Disp: 16 g, Rfl: 6   fluticasone -salmeterol (ADVAIR) 500-50 MCG/ACT AEPB, Inhale 1 puff into the lungs in the morning and at bedtime., Disp: 60 each, Rfl: 5   ipratropium (ATROVENT ) 0.06 % nasal spray, Place into both nostrils., Disp: , Rfl:    methylphenidate  (RITALIN  LA) 30 MG 24 hr capsule, Take 1 capsule (30 mg total) by  mouth every morning., Disp: 90 capsule, Rfl: 0   pantoprazole  (PROTONIX ) 40 MG tablet, TAKE 1 TABLET BY MOUTH EVERY DAY, Disp: 90 tablet, Rfl: 1   rosuvastatin  (CRESTOR ) 10 MG tablet, Take 1 tablet (10 mg total) by mouth daily., Disp: 90 tablet, Rfl: 1   Testosterone  20.25 MG/ACT (1.62%) GEL, APPLY 3 SQUIRTS ONTO THE SKIN DAILY, Disp: 75 g, Rfl: 5   Medications ordered in this encounter:  Meds ordered this encounter  Medications   benzonatate  (TESSALON ) 200 MG capsule    Sig: Take 1 capsule (200 mg total) by mouth 3 (three) times daily as needed for up to 7 days for cough.    Dispense:  21 capsule    Refill:  0    Supervising Provider:   LAMPTEY, PHILIP O [[id number]]   promethazine -dextromethorphan (PROMETHAZINE -DM) 6.25-15 MG/5ML syrup    Sig: Take 5 mLs by mouth at bedtime for 7 days.    Dispense:  40 mL    Refill:  0    Supervising Provider:   BLAISE ALEENE KIDD [[id number]]     *If you need refills on other medications prior to your next appointment, please contact your pharmacy*  Follow-Up: Call back or seek an in-person evaluation if the symptoms worsen or if the condition fails to improve as anticipated.  [facility] [phone]  Other Instructions  Get rest and adequate sleep  Drink plenty of water, broth, and other clear fluids to stay hydrated.  Use a  cool-mist humidifier Elevate the head of the bed to help with post nasal drainage Sip warm liquids, gargle with salt water, use lozenges, or suck on hard candy.  Use over-the-counter medications like acetaminophen  (Tylenol ) or ibuprofen (Advil, Motrin) as needed for fever and pain Honey cough drops can help alleviate cough symptoms.  Use saline nasal sprays or washes.  Over the counter mucinex, max strength ( blue and white box) to help loosen sinus congestion.  Please to the emergency room if any new or worsening symptoms  Please seek an in-person evaluation if the symptoms worsen or if the condition fails to  improve as anticipated.  PCP follow-up in 5-7 days  If you have been instructed to have an in-person evaluation today at a local Urgent Care facility, please use the link below. It will take you to a list of all of our available [facility], including address, phone number and hours of operation. Please do not delay care.  [facility]  If you or a family member do not have a primary care provider, use the link below to schedule a visit and establish care. When you choose a [facility] primary care physician or advanced practice provider, you gain a long-term partner in health. Find a Primary Care Provider  Learn more about [facility]'s in-office and virtual care options: [facility] - Get Care Now  "

## 2024-04-18 NOTE — Progress Notes (Signed)
 " Virtual Visit Consent   Jesse Hardin, you are scheduled for a virtual visit with a [facility] provider today. Just as with appointments in the office, your consent must be obtained to participate. Your consent will be active for this visit and any virtual visit you may have with one of our providers in the next 365 days. If you have a MyChart account, a copy of this consent can be sent to you electronically.  As this is a virtual visit, video technology does not allow for your provider to perform a traditional examination. This may limit your provider's ability to fully assess your condition. If your provider identifies any concerns that need to be evaluated in person or the need to arrange testing (such as labs, EKG, etc.), we will make arrangements to do so. Although advances in technology are sophisticated, we cannot ensure that it will always work on either your end or our end. If the connection with a video visit is poor, the visit may have to be switched to a telephone visit. With either a video or telephone visit, we are not always able to ensure that we have a secure connection.  By engaging in this virtual visit, you consent to the provision of healthcare and authorize for your insurance to be billed (if applicable) for the services provided during this visit. Depending on your insurance coverage, you may receive a charge related to this service.  I need to obtain your verbal consent now. Are you willing to proceed with your visit today? Jesse Hardin has provided verbal consent on 04/18/2024 for a virtual visit (video or telephone). Jesse Wickstrom, PA-C  Date: 04/18/2024 5:59 PM   Virtual Visit via Video Note   I, Jesse Hardin, connected with  Jesse Hardin  ([id number], May 14, 1960) on 04/18/24 at  5:45 PM EST by a video-enabled telemedicine application and verified that I am speaking with the correct person using two identifiers.  Location: Patient: Virtual Visit Location  Patient: Home Provider: Virtual Visit Location Provider: Home Office   I discussed the limitations of evaluation and management by telemedicine and the availability of in person appointments. The patient expressed understanding and agreed to proceed.    History of Present Illness: Jesse Hardin is a 64 y.o. who identifies as a male who was assigned male at birth, and is being seen today for cough.  HPI: Patient presents for evaluation of cough that started Tuesday.  Associated symptoms include nasal congestion, fatigue reports he was starting to feel better today, the cough is not as deep however for the past few hours the cough is worsening.  He has been taking over-the-counter cold medication without significant relief.  No known exposure to COVID or flu.  Denies fever, chills, nausea, vomiting.  He denies any chest pain, shortness of breath.    Problems:  Patient Active Problem List   Diagnosis Date Noted   Neck pain 10/04/2022   Decreased ROM of neck 10/04/2022   Radicular pain in left arm 10/04/2022   Elevated BP without diagnosis of hypertension 10/04/2022   Dacrocystitis, right 03/20/2022   Eczematous dermatitis of upper and lower eyelids of both eyes 03/20/2022   Cough variant asthma 12/08/2021   Hiatal hernia 09/23/2021   History of esophageal dilatation 09/23/2021   Tongue fissure 09/23/2021   Non-seasonal allergic rhinitis 06/03/2021   Asymptomatic varicose veins of both lower extremities 05/27/2021   Osteoporosis without current pathological fracture 01/04/2021   Screening for prostate cancer 11/24/2020   Radiculitis 11/24/2020  Screening for heart disease 11/24/2020   Attention and concentration deficit 07/08/2020   Hyperlipidemia 07/08/2020   Hemorrhoids 03/30/2020   Diverticulosis 03/30/2020   Polyp of colon 03/30/2020   Vaccine counseling 06/20/2017   Encounter for health maintenance examination in adult 06/20/2017   Family history of heart disease  06/20/2017   Screen for colon cancer 06/20/2017   Generalized anxiety disorder 01/03/2016   Vitamin D  deficiency 11/16/2015   Bipolar disorder with depression (HCC) 11/01/2015   Insomnia 11/01/2015   Low testosterone  11/01/2015    Allergies: Allergies[1] Medications: Current Medications[2]  Observations/Objective: Patient is well-developed, well-nourished in no acute distress.  Resting comfortably  at home.  Head is normocephalic, atraumatic.  No labored breathing.  Dry cough over video Appears congested No sinus TTP reported Speech is clear and coherent with logical content.  Patient is alert and oriented at baseline.    Assessment and Plan: 1. Viral URI with cough (Primary) - benzonatate  (TESSALON ) 200 MG capsule; Take 1 capsule (200 mg total) by mouth 3 (three) times daily as needed for up to 7 days for cough.  Dispense: 21 capsule; Refill: 0 - promethazine -dextromethorphan (PROMETHAZINE -DM) 6.25-15 MG/5ML syrup; Take 5 mLs by mouth at bedtime for 7 days.  Dispense: 40 mL; Refill: 0  I did advise patient to complete an at home COVID/FLU test. Message back with results and treatment plan will be adjusted if indicated Outside of treatment window for anti-viral treatment  Get rest and adequate sleep  Drink plenty of water, broth, and other clear fluids to stay hydrated.  Use a cool-mist humidifier Elevate the head of the bed to help with post nasal drainage Sip warm liquids, gargle with salt water, use lozenges, or suck on hard candy.  Use over-the-counter medications like acetaminophen  (Tylenol ) or ibuprofen (Advil, Motrin) as needed for fever and pain Honey cough drops can help alleviate cough symptoms.  Use saline nasal sprays or washes.  Over the counter mucinex, max strength ( blue and white box) to help loosen sinus congestion.  Please to the emergency room if any new or worsening symptoms  Please seek an in-person evaluation if the symptoms worsen or if the condition  fails to improve as anticipated.  PCP follow-up in 5-7 days    Follow Up Instructions: I discussed the assessment and treatment plan with the patient. The patient was provided an opportunity to ask questions and all were answered. The patient agreed with the plan and demonstrated an understanding of the instructions.  A copy of instructions were sent to the patient via MyChart unless otherwise noted below.    The patient was advised to call back or seek an in-person evaluation if the symptoms worsen or if the condition fails to improve as anticipated.    Zaion Hreha, PA-C     [1] No Known Allergies [2]  Current Outpatient Medications:    benzonatate  (TESSALON ) 200 MG capsule, Take 1 capsule (200 mg total) by mouth 3 (three) times daily as needed for up to 7 days for cough., Disp: 21 capsule, Rfl: 0   promethazine -dextromethorphan (PROMETHAZINE -DM) 6.25-15 MG/5ML syrup, Take 5 mLs by mouth at bedtime for 7 days., Disp: 40 mL, Rfl: 0   b complex vitamins capsule, Take 1 capsule by mouth daily., Disp: 30 capsule, Rfl: 1   buPROPion  (WELLBUTRIN  XL) 300 MG 24 hr tablet, Take 1 tablet (300 mg total) by mouth daily., Disp: 90 tablet, Rfl: 1   Cetirizine HCl (ZYRTEC PO), Take by mouth. Alternating every other day  with allegra, Disp: , Rfl:    Cholecalciferol (VITAMIN D3) 50 MCG (2000 UT) capsule, Take 1 capsule (2,000 Units total) by mouth daily., Disp: 90 capsule, Rfl: 1   FEXOFENADINE HCL PO, Take by mouth. Every other day, Disp: , Rfl:    fluticasone  (FLONASE ) 50 MCG/ACT nasal spray, Place 2 sprays into both nostrils daily., Disp: 16 g, Rfl: 6   fluticasone -salmeterol (ADVAIR) 500-50 MCG/ACT AEPB, Inhale 1 puff into the lungs in the morning and at bedtime., Disp: 60 each, Rfl: 5   ipratropium (ATROVENT ) 0.06 % nasal spray, Place into both nostrils., Disp: , Rfl:    methylphenidate  (RITALIN  LA) 30 MG 24 hr capsule, Take 1 capsule (30 mg total) by mouth every morning., Disp: 90 capsule, Rfl:  0   pantoprazole  (PROTONIX ) 40 MG tablet, TAKE 1 TABLET BY MOUTH EVERY DAY, Disp: 90 tablet, Rfl: 1   rosuvastatin  (CRESTOR ) 10 MG tablet, Take 1 tablet (10 mg total) by mouth daily., Disp: 90 tablet, Rfl: 1   Testosterone  20.25 MG/ACT (1.62%) GEL, APPLY 3 SQUIRTS ONTO THE SKIN DAILY, Disp: 75 g, Rfl: 5  "

## 2024-04-21 ENCOUNTER — Encounter: Payer: Self-pay | Admitting: Medical

## 2024-04-28 ENCOUNTER — Ambulatory Visit: Admitting: Family Medicine

## 2024-05-07 ENCOUNTER — Encounter: Payer: Self-pay | Admitting: Family Medicine

## 2024-05-07 ENCOUNTER — Other Ambulatory Visit: Payer: Self-pay | Admitting: Medical Genetics

## 2024-05-07 DIAGNOSIS — Z006 Encounter for examination for normal comparison and control in clinical research program: Secondary | ICD-10-CM

## 2024-05-08 MED ORDER — ROSUVASTATIN CALCIUM 10 MG PO TABS: 10.0000 mg | ORAL_TABLET | Freq: Every day | ORAL | 1 refills | Status: AC

## 2024-05-08 MED ORDER — METHYLPHENIDATE HCL ER (LA) 30 MG PO CP24: 30.0000 mg | ORAL_CAPSULE | ORAL | 0 refills | Status: AC

## 2024-05-09 ENCOUNTER — Encounter: Payer: Self-pay | Admitting: Family Medicine

## 2024-05-26 ENCOUNTER — Other Ambulatory Visit (HOSPITAL_COMMUNITY): Payer: Self-pay

## 2025-01-09 ENCOUNTER — Encounter: Payer: Self-pay | Admitting: Family Medicine
